# Patient Record
Sex: Male | Born: 1980 | Race: White | Hispanic: No | Marital: Married | Smoking: Current every day smoker
Health system: Southern US, Community
[De-identification: ages and names within clinical notes are randomized; demographics above are authoritative.]

## PROBLEM LIST (undated history)

## (undated) DIAGNOSIS — T7840XA Allergy, unspecified, initial encounter: Secondary | ICD-10-CM

## (undated) DIAGNOSIS — R519 Headache, unspecified: Secondary | ICD-10-CM

## (undated) DIAGNOSIS — Z87828 Personal history of other (healed) physical injury and trauma: Secondary | ICD-10-CM

## (undated) DIAGNOSIS — G47419 Narcolepsy without cataplexy: Secondary | ICD-10-CM

## (undated) DIAGNOSIS — R51 Headache: Secondary | ICD-10-CM

## (undated) DIAGNOSIS — J329 Chronic sinusitis, unspecified: Secondary | ICD-10-CM

## (undated) HISTORY — DX: Headache, unspecified: R51.9

## (undated) HISTORY — DX: Chronic sinusitis, unspecified: J32.9

## (undated) HISTORY — DX: Narcolepsy without cataplexy: G47.419

## (undated) HISTORY — DX: Allergy, unspecified, initial encounter: T78.40XA

## (undated) HISTORY — DX: Headache: R51

## (undated) HISTORY — PX: HERNIA REPAIR: SHX51

## (undated) HISTORY — PX: PATELLAR TENDON REPAIR: SHX737

## (undated) HISTORY — DX: Personal history of other (healed) physical injury and trauma: Z87.828

---

## 2003-07-07 ENCOUNTER — Ambulatory Visit (HOSPITAL_BASED_OUTPATIENT_CLINIC_OR_DEPARTMENT_OTHER): Admission: RE | Admit: 2003-07-07 | Discharge: 2003-07-07 | Payer: Self-pay | Admitting: Internal Medicine

## 2003-07-28 ENCOUNTER — Ambulatory Visit (HOSPITAL_BASED_OUTPATIENT_CLINIC_OR_DEPARTMENT_OTHER): Admission: RE | Admit: 2003-07-28 | Discharge: 2003-07-28 | Payer: Self-pay | Admitting: Internal Medicine

## 2009-03-24 ENCOUNTER — Observation Stay (HOSPITAL_COMMUNITY): Admission: EM | Admit: 2009-03-24 | Discharge: 2009-03-25 | Payer: Self-pay | Admitting: Emergency Medicine

## 2009-05-09 ENCOUNTER — Encounter: Admission: RE | Admit: 2009-05-09 | Discharge: 2009-08-07 | Payer: Self-pay | Admitting: Specialist

## 2009-08-08 ENCOUNTER — Encounter: Admission: RE | Admit: 2009-08-08 | Discharge: 2009-08-16 | Payer: Self-pay | Admitting: Specialist

## 2011-03-06 NOTE — H&P (Signed)
NAME:  Curtis Moreno, Curtis Moreno NO.:  [id number]   MEDICAL RECORD NO.:  [id number]          PATIENT TYPE:  OBV   LOCATION:  0098                         FACILITY:  [facility]   PHYSICIAN:  Erasmo Leventhal, M.D.DATE OF BIRTH:  [birth year]   DATE OF ADMISSION:  03/24/2009  DATE OF DISCHARGE:                              HISTORY & PHYSICAL   CHIEF COMPLAINT:  Painful left knee, unable to extend.   HISTORY OF PRESENT ILLNESS:  The patient is a 30 year old gentleman who  was playing basketball today, when he banged his left knee into a  different player.  He had immediate pain.  He fell to the ground and was  unable to extend his knee and ambulate.  He was brought to the emergency  room.  X-rays revealed there was no obvious fractures, but he had  significant patella alta.  Examination found that he had a patella  tendon rupture.   PAST MEDICAL HISTORY:  Narcolepsy   ALLERGIES:  NO KNOWN DRUG ALLERGIES.   CURRENT MEDICATIONS:  None.   SOCIAL HISTORY:  Patient is married.  Denies any smoking or street drug  use.  He occasionally has an alcoholic beverage.  He manages a [employer].   REVIEW OF SYSTEMS:  He denies any shortness breath or chest pains.  No  other injuries.  No recent fevers or chills or illnesses.   PHYSICAL EXAMINATION:  VITAL SIGNS:  Temperature is 97.6, a pulse of 90,  respirations 18, blood pressure is 110/60.  GENERAL:  This is a healthy-appearing, well-developed gentleman,  conscious, alert and appropriate, on hospital room gurney, with his left  leg in a long-leg immobilizer.  HEENT: Head was normocephalic.  Pupils equal, round and reactive.  NECK:  There was good range of motion, no lymphadenopathy.  CHEST:  Lung sounds clear and equal bilaterally.  HEART:  Regular rate and rhythm.  No murmurs, rubs or gallops.  ABDOMEN:  Soft, nontender.  Bowel sounds present.  EXTREMITIES:  Upper extremities had excellent range of motion, without  any difficulty.   Lower extremities:  Left knee was significantly  swollen.  He has significant effusion.  He had a palpable defect  inferior to the patella.  He was unable to straight leg raise.  He did  have pain.  His calf was soft, nontender.  Good sensation to the lower  foot.  His right leg:  He was able to straight leg raise.  He had good  range of motion of his knee and ankle today.  NEURO:  The patient was conscious, alert and appropriate.  BREASTS/RECTAL/GENITOURINARY:  exams were deferred at this time.   IMPRESSION:  1. Left knee patella tendon rupture.  2. History of narcolepsy   PLAN:  The patient will be admitted to [facility] under the  care of Dr. Erasmo Leventhal on a 23-hour observation.  Will obtain  an operating room time and take him to the OR for an ORIF of his left  patella tendon.  The patient did have one discussion with Dr. Thomasena Edis  about the procedure and his need  for having this done.  He was also told  what the possible complications are.      Jamelle Rushing, P.A.    ______________________________  Erasmo Leventhal, M.D.    RWK/MEDQ  D:  03/24/2009  T:  03/24/2009  Job:  [id number]

## 2011-03-06 NOTE — Op Note (Signed)
NAME:  Curtis Moreno, POCH NO.:  [id number]   MEDICAL RECORD NO.:  [id number]          PATIENT TYPE:  OBV   LOCATION:  0098                         FACILITY:  [facility]   PHYSICIAN:  Erasmo Leventhal, M.D.DATE OF BIRTH:  Mar 10, 1981   DATE OF PROCEDURE:  03/24/2009  DATE OF DISCHARGE:                               OPERATIVE REPORT   PREOPERATIVE DIAGNOSIS:  Left knee acute complete rupture of patella  tendon.   POSTOPERATIVE DIAGNOSIS:  Left knee acute complete rupture of patella  tendon, plus extension into medial and lateral capsule and retinaculum.   PROCEDURE:  Left knee primary repair of patella tendon rupture, and  medial and lateral capsule and retinaculum extensor mechanism.   SURGEON:  Erasmo Leventhal, M.D.   ASSISTANT:  Oneida Alar, PA-C.   ANESTHESIA:  General with femoral nerve block, intraoperatively.   ESTIMATED BLOOD LOSS:  Less than 50 mL.   DRAINS:  None.   COMPLICATIONS:  None.   TOURNIQUET TIME:  45 minutes at 300 mmHg.   DRAINS:  None.   COMPLICATIONS:  None.   DISPOSITION:  PACU stable.   OPERATIVE DETAILS:  The patient counseled in the holding area and the  correct side was identified.  IV started.  Antibiotics were given on the  way from the ER.  In the operating room he was placed under general  anesthesia.  All extremities well padded and bumped.  Left knee was  shaved appropriately.  Prepped with DuraPrep and draped in sterile  fashion exsanguinated with Esmarch and tourniquet was inflated to 300  mmHg.  Straight midline incision made in the skin and subcutaneous  tissue overlying the extensor mechanism and carried through the skin and  subcutaneous tissue.  Retinaculum was opened found to have rupture of  the medial and lateral retinaculum and the arthrotomy and capsule.  Also  complete rupture of the patellar tendon in an oblique coronal fashion  from the patella.  The edges were sharpened back to healthy tissue.  Knee  was then inspected and the ACL and PCL were intact from the limited  view and knee joint was copiously irrigated.  In full extension we set  the correct tension on the patellar tendon, drill holes placed in the  patella, #5 FiberWire sutures were placed in a grasping fashion, placed  through the drill holes and back on themselves giving anatomic repair of  the patella tendon back at the appropriate amount of tension and length.  This was then reinforced medial and lateral with #2 FiberWire sutures  grasping the capsule.   The retinaculum was next closed as a separate layer with Vicryl suture,  subcu Vicryl, skin with subcu Monocryl suture.  Steri-Strips applied.  Dressings applied.  Tourniquet was deflated.  Normal circulation of foot  and ankle at end of the case.  Another gram of Ancef was given  intravenously at the end of the case.  Placed a well-molded knee  immobilizer in full extension.  Properly fitted and contoured.  He was  then awakened, taken from operating room to PACU.   He also had  a femoral nerve block administered in the operating room by  the anesthesiologist.  He was awakened, and taken from operating room to  PACU stable condition.  Sponge and needle count correct.  No  complications or problems.   To help with surgical technique and decision making Mr. Oneida Alar, PA-C's  assistance was needed throughout the entire case.           ______________________________  Erasmo Leventhal, M.D.     RAC/MEDQ  D:  03/24/2009  T:  03/25/2009  Job:  [id number]

## 2013-06-15 ENCOUNTER — Ambulatory Visit (INDEPENDENT_AMBULATORY_CARE_PROVIDER_SITE_OTHER): Payer: BC Managed Care – PPO | Admitting: Internal Medicine

## 2013-06-15 VITALS — BP 132/74 | HR 65 | Temp 98.0°F | Resp 16 | Ht 74.75 in | Wt 184.0 lb

## 2013-06-15 DIAGNOSIS — M5416 Radiculopathy, lumbar region: Secondary | ICD-10-CM

## 2013-06-15 DIAGNOSIS — M25579 Pain in unspecified ankle and joints of unspecified foot: Secondary | ICD-10-CM

## 2013-06-15 DIAGNOSIS — IMO0002 Reserved for concepts with insufficient information to code with codable children: Secondary | ICD-10-CM

## 2013-06-15 DIAGNOSIS — M25572 Pain in left ankle and joints of left foot: Secondary | ICD-10-CM

## 2013-06-15 DIAGNOSIS — Z0000 Encounter for general adult medical examination without abnormal findings: Secondary | ICD-10-CM

## 2013-06-15 DIAGNOSIS — G47419 Narcolepsy without cataplexy: Secondary | ICD-10-CM

## 2013-06-15 LAB — CBC WITH DIFFERENTIAL/PLATELET
Basophils Absolute: 0 10*3/uL (ref 0.0–0.1)
Eosinophils Absolute: 0.2 10*3/uL (ref 0.0–0.7)
Eosinophils Relative: 3 % (ref 0–5)
Lymphocytes Relative: 31 % (ref 12–46)
Lymphs Abs: 1.8 10*3/uL (ref 0.7–4.0)
MCV: 89.9 fL (ref 78.0–100.0)
Neutrophils Relative %: 60 % (ref 43–77)
Platelets: 208 10*3/uL (ref 150–400)
RBC: 4.54 MIL/uL (ref 4.22–5.81)
RDW: 12.9 % (ref 11.5–15.5)
WBC: 5.9 10*3/uL (ref 4.0–10.5)

## 2013-06-15 LAB — POCT URINALYSIS DIPSTICK
Bilirubin, UA: NEGATIVE
Glucose, UA: NEGATIVE
Ketones, UA: NEGATIVE
Leukocytes, UA: NEGATIVE
Nitrite, UA: NEGATIVE
pH, UA: 6

## 2013-06-15 LAB — COMPREHENSIVE METABOLIC PANEL
ALT: 17 U/L (ref 0–53)
AST: 36 U/L (ref 0–37)
CO2: 30 mEq/L (ref 19–32)
Chloride: 106 mEq/L (ref 96–112)
Creat: 0.8 mg/dL (ref 0.50–1.35)
Sodium: 140 mEq/L (ref 135–145)
Total Bilirubin: 0.8 mg/dL (ref 0.3–1.2)
Total Protein: 7.1 g/dL (ref 6.0–8.3)

## 2013-06-15 LAB — LIPID PANEL
LDL Cholesterol: 73 mg/dL (ref 0–99)
Total CHOL/HDL Ratio: 2.9 Ratio
VLDL: 13 mg/dL (ref 0–40)

## 2013-06-15 LAB — POCT UA - MICROSCOPIC ONLY
RBC, urine, microscopic: NEGATIVE
WBC, Ur, HPF, POC: NEGATIVE
Yeast, UA: NEGATIVE

## 2013-06-15 LAB — TSH: TSH: 1.332 u[IU]/mL (ref 0.350–4.500)

## 2013-06-15 MED ORDER — AMPHETAMINE-DEXTROAMPHETAMINE 20 MG PO TABS: 20.0000 mg | ORAL_TABLET | Freq: Two times a day (BID) | ORAL | Status: DC

## 2013-06-15 MED ORDER — CYCLOBENZAPRINE HCL 10 MG PO TABS: 10.0000 mg | ORAL_TABLET | Freq: Every day | ORAL | Status: DC

## 2013-06-15 NOTE — Progress Notes (Signed)
Subjective:    Patient ID: Curtis Moreno, male    DOB: [birth year], 32 y.o.   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 Cori Razor problems also included nightmares and nonrestorative sleep He still has restless sleep with nightmares He does extremely well during the day with Adderall    Review of Systems  Constitutional: Negative for fever, activity change, appetite change, fatigue and unexpected weight change.  HENT: Negative for hearing loss, congestion, rhinorrhea, trouble swallowing, neck pain and ear discharge.   Eyes: Negative for visual disturbance.  Respiratory: Negative for apnea and shortness of breath.   Cardiovascular: Negative for chest pain, palpitations and leg swelling.  Gastrointestinal: Negative for abdominal pain, diarrhea and constipation.  Endocrine: Negative for cold intolerance and polydipsia.  Genitourinary: Negative for dysuria, frequency and difficulty urinating.  Musculoskeletal: Positive for back pain. Negative for joint swelling and gait problem.       1. Chronic ankle pain on the left after a significant sprain injury many years ago//he now has pain continuously without much swelling. He notices a lot of popping and grinding  2. he is ruptured his patellar tendon and had to have surgery 2 years ago and still has a little discomfort in the scar line 3. he has persistent low back discomfort particularly with bending over and lifting. There are some radicular symptoms on the right side but no weakness or numbness. This does not interfere with sleep  Neurological: Negative for dizziness, weakness, light-headedness and headaches.  Psychiatric/Behavioral: Negative for behavioral  problems and dysphoric mood.       Objective:   Physical Exam  Constitutional: He is oriented to person, place, and time. He appears well-developed and well-nourished.  HENT:  Head: Normocephalic.  Right Ear: External ear normal.  Left Ear: External ear normal.  Nose: Nose normal.  Mouth/Throat: Oropharynx is clear and moist.  Eyes: Conjunctivae and EOM are normal. Pupils are equal, round, and reactive to light.  Neck: Normal range of motion. Neck supple. No thyromegaly present.  Cardiovascular: Normal rate, regular rhythm, normal heart sounds and intact distal pulses.  Exam reveals no gallop and no friction rub.   No murmur heard. Pulmonary/Chest: Effort normal and breath sounds normal. He has no wheezes.  Abdominal: Soft. Bowel sounds are normal. He exhibits no mass. There is tenderness. There is no rebound and no guarding.  Mildly tender in the right lower quadrant  Genitourinary: Penis normal.  Both testicles are small/there is a lump structure in the vas deferens on the right that has been present for several years/no testicular masses  Musculoskeletal: He exhibits no edema.  The left ankle is very tender along the route of the peroneal tendon. The ankle itself has a crepitus feel to inversion felt over the lateral malleolus//there is a callus under the second MTP on the left that is not present on the right  He has a right-sided lumbar pain with straight-leg raise on the right to 75 with some radiation into the right buttock/no sensory or motor losses and no change in reflexes  Lymphadenopathy:    He has no cervical adenopathy.  Neurological: He is alert and oriented to person, place, and time. He has normal reflexes. No cranial nerve deficit. Coordination normal.  Skin: No rash noted.  Psychiatric: He has a normal mood and affect. His behavior is normal. Judgment and thought content normal.          Results for orders placed in visit on 06/15/13  POCT UA - MICROSCOPIC  ONLY      Result Value Range   WBC, Ur, HPF, POC neg     RBC, urine, microscopic neg     Bacteria, U Microscopic neg     Mucus, UA neg     Epithelial cells, urine per micros neg     Crystals, Ur, HPF, POC neg     Casts, Ur, LPF, POC neg     Yeast, UA neg    POCT URINALYSIS DIPSTICK      Result Value Range   Color, UA yellow     Clarity, UA clear     Glucose, UA neg     Bilirubin, UA neg     Ketones, UA neg     Spec Grav, UA >=1.030     Blood, UA neg     pH, UA 6.0     Protein, UA neg     Urobilinogen, UA 0.2     Nitrite, UA neg     Leukocytes, UA Negative      Assessment & Plan:  CPE  #1 narcolepsy  #2 idiopathic sleep disorder  #3 chronic ankle pain  #4 status post patellar tendon rupture with surgery  #5 chronic low back pain /? Right lower quadrant pain   Referred to sports medicine-Dr. Margaretha Sheffield Consider sleep reevaluation Continue Adderall for now Use Flexeril at bedtime for the next few months Discussed yoga/gave handout booklet for chronic back pain with exercises and posture habits At next visit review immunizations   Meds ordered this encounter  Medications  . amphetamine-dextroamphetamine (ADDERALL) 20 MG tablet    Sig: Take 1 tablet (20 mg total) by mouth 2 (two) times daily.    Dispense:  60 tablet    Refill:  0  . amphetamine-dextroamphetamine (ADDERALL) 20 MG tablet    Sig: Take 1 tablet (20 mg total) by mouth 2 (two) times daily. For 07/16/13    Dispense:  60 tablet    Refill:  0  . amphetamine-dextroamphetamine (ADDERALL) 20 MG tablet    Sig: Take 1 tablet (20 mg total) by mouth 2 (two) times daily. For 08/15/13    Dispense:  60 tablet    Refill:  0  . cyclobenzaprine (FLEXERIL) 10 MG tablet    Sig: Take 1 tablet (10 mg total) by mouth at bedtime. For back pain    Dispense:  30 tablet    Refill:  2   Recheck 3 months or call for refills and recheck in 6 months

## 2013-06-21 ENCOUNTER — Encounter: Payer: Self-pay | Admitting: Internal Medicine

## 2013-07-01 ENCOUNTER — Ambulatory Visit: Payer: Self-pay | Admitting: Sports Medicine

## 2013-07-15 ENCOUNTER — Ambulatory Visit (INDEPENDENT_AMBULATORY_CARE_PROVIDER_SITE_OTHER): Payer: BC Managed Care – PPO | Admitting: Sports Medicine

## 2013-07-15 VITALS — BP 107/70 | Ht 75.0 in | Wt 185.0 lb

## 2013-07-15 DIAGNOSIS — S9306XA Dislocation of unspecified ankle joint, initial encounter: Secondary | ICD-10-CM

## 2013-07-15 DIAGNOSIS — S9305XA Dislocation of left ankle joint, initial encounter: Secondary | ICD-10-CM

## 2013-07-15 DIAGNOSIS — S93332A Other subluxation of left foot, initial encounter: Secondary | ICD-10-CM

## 2013-07-15 DIAGNOSIS — M25579 Pain in unspecified ankle and joints of unspecified foot: Secondary | ICD-10-CM

## 2013-07-15 DIAGNOSIS — M25572 Pain in left ankle and joints of left foot: Secondary | ICD-10-CM

## 2013-07-15 NOTE — Progress Notes (Signed)
  Subjective:    Patient ID: Curtis Moreno, male    DOB: 19-Dec-1980, 32 y.o.   MRN: [id number]  HPI chief complaint: Left ankle pain  Very pleasant 32 year old male comes in today for evaluation of his left ankle. Many years ago he suffered a rather severe ankle sprain and since then he has had persistent lateral pain and popping. He had a significant amount of swelling with the initial injury but denies any residual swelling. He localizes all of his symptoms to the retromalleolar area behind the lateral malleolus. No radiating pain into his foot. Although his symptoms are persistent they do not interfere with his quality of life. He just "notices the pain". He has not had any recent imaging. He is also complaining of pain at the plantar aspect of his left foot, specifically along the second metatarsal head. His job requires him to stand for long periods of time. No prior surgeries to either his foot or ankle in the past.  Past medical history and current medications are reviewed. Surgical history is significant for left knee patellar tendon repair done many years ago while in high school No known drug allergies Does not smoke, denies alcohol use, and works as a Art therapist for [employer]    Review of Systems     Objective:   Physical Exam Well-developed, well-nourished. No acute distress. Awake alert and oriented x3. Vital signs are reviewed  Left knee: Full range of motion. No effusion. Well-healed midline incision consistent with his prior patellar tendon repair. Good strength. Good stability.  Left ankle: Full range of motion. No effusion. No soft tissue swelling. 2+ talar tilt, positive anterior drawer. Tenderness to palpation in the retromalleolar groove and mild pain with resisted foot eversion. There is palpable and audible snapping of the peroneal tendons over the lateral malleolus with active foot circumduction. It is not terribly painful however. No tenderness to palpation  over the medial ankle. No tenderness at the base of the fifth metatarsal. Patient has collapse of the transverse arch with standing. There is callus formation over the second metatarsal head and tenderness to palpation here. Walking without a limp.       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 Therefore, I've recommended that we start with a body helix compression sleeve to be worn with activity and couple this with an aggressive home exercise ankle rehabilitation program. Patient will followup with me in 6 weeks. We will also give him a pair of green sports insoles with a metatarsal pad on the left for his current metatarsalgia.

## 2013-08-26 ENCOUNTER — Ambulatory Visit: Payer: BC Managed Care – PPO | Admitting: Sports Medicine

## 2013-08-27 ENCOUNTER — Other Ambulatory Visit: Payer: Self-pay

## 2013-09-28 ENCOUNTER — Ambulatory Visit (INDEPENDENT_AMBULATORY_CARE_PROVIDER_SITE_OTHER): Payer: BC Managed Care – PPO | Admitting: Internal Medicine

## 2013-09-28 VITALS — BP 122/76 | HR 76 | Temp 98.4°F | Resp 12 | Ht 76.0 in | Wt 180.0 lb

## 2013-09-28 DIAGNOSIS — G47419 Narcolepsy without cataplexy: Secondary | ICD-10-CM

## 2013-09-28 MED ORDER — AMPHETAMINE-DEXTROAMPHETAMINE 20 MG PO TABS: 20.0000 mg | ORAL_TABLET | Freq: Two times a day (BID) | ORAL | Status: DC

## 2013-09-30 NOTE — Progress Notes (Signed)
Patient Active Problem List   Diagnosis Date Noted  . Narcolepsy 06/15/2013  doing well with meds Has #1 [employer] Back ankle both better Ros -neg Exam-stable  Plan Meds ordered this encounter  Medications  . amphetamine-dextroamphetamine (ADDERALL) 20 MG tablet    Sig: Take 1 tablet (20 mg total) by mouth 2 (two) times daily. For 60 days after signing date    Dispense:  60 tablet    Refill:  0  . amphetamine-dextroamphetamine (ADDERALL) 20 MG tablet    Sig: Take 1 tablet (20 mg total) by mouth 2 (two) times daily.    Dispense:  60 tablet    Refill:  0  . amphetamine-dextroamphetamine (ADDERALL) 20 MG tablet    Sig: Take 1 tablet (20 mg total) by mouth 2 (two) times daily. For 30 days after date signed    Dispense:  60 tablet    Refill:  0   Call 85mo   F/u 6mos

## 2013-10-28 ENCOUNTER — Telehealth: Payer: Self-pay

## 2013-10-28 DIAGNOSIS — G47419 Narcolepsy without cataplexy: Secondary | ICD-10-CM

## 2013-10-28 MED ORDER — AMPHETAMINE-DEXTROAMPHETAMINE 20 MG PO TABS: 20.0000 mg | ORAL_TABLET | Freq: Two times a day (BID) | ORAL | Status: DC

## 2013-10-28 NOTE — Telephone Encounter (Signed)
He states he placed in a bag with his prescription, and he thinks they were thrown away please advise, will you replace his lost Rx's

## 2013-10-28 NOTE — Telephone Encounter (Signed)
Yes, this is correct, pended for signature, Shota Kohrs

## 2013-10-28 NOTE — Telephone Encounter (Signed)
He is completely honest/trustworthy and is taking meds for narcolepsy so I'm glad to replace-- 1/8 and 2/8 right???

## 2013-10-28 NOTE — Telephone Encounter (Signed)
PT STATES HE HAVE MISPLACED HIS ADDERALL PRESCRIPTION. PLEASE CALL [phone]

## 2013-10-29 NOTE — Telephone Encounter (Signed)
Pt called back and stated that he found his rx - it had fallen behind his dresser. He states he doesn't need the rx replaced.  bf

## 2013-10-30 NOTE — Telephone Encounter (Signed)
Replacement Rxs shredded.

## 2014-01-12 ENCOUNTER — Other Ambulatory Visit: Payer: Self-pay

## 2014-01-12 DIAGNOSIS — G47419 Narcolepsy without cataplexy: Secondary | ICD-10-CM

## 2014-01-12 NOTE — Telephone Encounter (Signed)
Pt requesting refill for adderall  [facility] phone [phone]

## 2014-01-13 MED ORDER — AMPHETAMINE-DEXTROAMPHETAMINE 20 MG PO TABS: 20.0000 mg | ORAL_TABLET | Freq: Two times a day (BID) | ORAL | Status: DC

## 2014-01-13 NOTE — Telephone Encounter (Signed)
Pended.

## 2014-01-15 NOTE — Telephone Encounter (Signed)
Pt has p/up Rx. 

## 2014-04-19 ENCOUNTER — Telehealth: Payer: Self-pay

## 2014-04-19 DIAGNOSIS — G47419 Narcolepsy without cataplexy: Secondary | ICD-10-CM

## 2014-04-19 NOTE — Telephone Encounter (Signed)
[phone]  amphetamine-dextroamphetamine (ADDERALL) 20 MG tablet   Refill  Dr Merla Richesoolittle

## 2014-04-20 MED ORDER — AMPHETAMINE-DEXTROAMPHETAMINE 20 MG PO TABS: 20.0000 mg | ORAL_TABLET | Freq: Two times a day (BID) | ORAL | Status: DC

## 2014-04-20 NOTE — Telephone Encounter (Signed)
Patient Active Problem List   Diagnosis Date Noted  . Narcolepsy 06/15/2013   Meds ordered this encounter  Medications  . amphetamine-dextroamphetamine (ADDERALL) 20 MG tablet    Sig: Take 1 tablet (20 mg total) by mouth 2 (two) times daily. For 30 days after date signed    Dispense:  60 tablet    Refill:  0  . amphetamine-dextroamphetamine (ADDERALL) 20 MG tablet    Sig: Take 1 tablet (20 mg total) by mouth 2 (two) times daily. For 60 days after date signed.    Dispense:  60 tablet    Refill:  0  . amphetamine-dextroamphetamine (ADDERALL) 20 MG tablet    Sig: Take 1 tablet (20 mg total) by mouth 2 (two) times daily.    Dispense:  60 tablet    Refill:  0   Ok to f/u 6 mos

## 2014-04-20 NOTE — Telephone Encounter (Signed)
Advised pt rx will be ready for pick up after 6.

## 2014-07-20 ENCOUNTER — Telehealth: Payer: Self-pay

## 2014-07-20 DIAGNOSIS — G47419 Narcolepsy without cataplexy: Secondary | ICD-10-CM

## 2014-07-20 NOTE — Telephone Encounter (Signed)
Refill request   amphetamine-dextroamphetamine (ADDERALL) 20 MG tablet   [phone]

## 2014-07-21 MED ORDER — AMPHETAMINE-DEXTROAMPHETAMINE 20 MG PO TABS: 20.0000 mg | ORAL_TABLET | Freq: Two times a day (BID) | ORAL | Status: DC

## 2014-07-21 NOTE — Telephone Encounter (Signed)
Notified pt ready. 

## 2014-10-27 ENCOUNTER — Telehealth: Payer: Self-pay

## 2014-10-27 DIAGNOSIS — G47419 Narcolepsy without cataplexy: Secondary | ICD-10-CM

## 2014-10-27 NOTE — Telephone Encounter (Signed)
Pt request Adderall refill   [facility] phone for pt is [phone]

## 2014-10-29 MED ORDER — AMPHETAMINE-DEXTROAMPHETAMINE 20 MG PO TABS: 20.0000 mg | ORAL_TABLET | Freq: Two times a day (BID) | ORAL | Status: DC

## 2014-10-29 NOTE — Telephone Encounter (Signed)
Ok --dx narcolepsy Meds ordered this encounter  Medications  . amphetamine-dextroamphetamine (ADDERALL) 20 MG tablet    Sig: Take 1 tablet (20 mg total) by mouth 2 (two) times daily. For 30 days after date signed    Dispense:  60 tablet    Refill:  0  . amphetamine-dextroamphetamine (ADDERALL) 20 MG tablet    Sig: Take 1 tablet (20 mg total) by mouth 2 (two) times daily. For 60 days after date signed.    Dispense:  60 tablet    Refill:  0  . amphetamine-dextroamphetamine (ADDERALL) 20 MG tablet    Sig: Take 1 tablet (20 mg total) by mouth 2 (two) times daily.    Dispense:  60 tablet    Refill:  0   If he needs a prescription before Monday night someone else can sign prescription #1 for him

## 2014-10-29 NOTE — Telephone Encounter (Signed)
Pt advised to pick up script on Monday

## 2015-02-01 ENCOUNTER — Telehealth: Payer: Self-pay

## 2015-02-01 DIAGNOSIS — G47419 Narcolepsy without cataplexy: Secondary | ICD-10-CM

## 2015-02-01 MED ORDER — AMPHETAMINE-DEXTROAMPHETAMINE 20 MG PO TABS: 20.0000 mg | ORAL_TABLET | Freq: Two times a day (BID) | ORAL | Status: DC

## 2015-02-01 NOTE — Telephone Encounter (Signed)
Pt's wife called wanting a refill on her husband's prescription for his  amphetamine-dextroamphetamine (ADDERALL) 20 MG tablet [[id number]][[id number]]. Please advise at [phone]

## 2015-02-01 NOTE — Telephone Encounter (Signed)
Narcolepsy Meds ordered this encounter  Medications  . amphetamine-dextroamphetamine (ADDERALL) 20 MG tablet    Sig: Take 1 tablet (20 mg total) by mouth 2 (two) times daily.    Dispense:  60 tablet    Refill:  0  . amphetamine-dextroamphetamine (ADDERALL) 20 MG tablet    Sig: Take 1 tablet (20 mg total) by mouth 2 (two) times daily. For 60 days after date signed.    Dispense:  60 tablet    Refill:  0  . amphetamine-dextroamphetamine (ADDERALL) 20 MG tablet    Sig: Take 1 tablet (20 mg total) by mouth 2 (two) times daily. For 30 days after date signed    Dispense:  60 tablet    Refill:  0

## 2015-02-01 NOTE — Telephone Encounter (Signed)
Called pt, .rx ready for pick up. Left VM.

## 2015-05-03 ENCOUNTER — Telehealth: Payer: Self-pay

## 2015-05-03 DIAGNOSIS — G47419 Narcolepsy without cataplexy: Secondary | ICD-10-CM

## 2015-05-03 MED ORDER — AMPHETAMINE-DEXTROAMPHETAMINE 20 MG PO TABS: 20.0000 mg | ORAL_TABLET | Freq: Two times a day (BID) | ORAL | Status: DC

## 2015-05-03 NOTE — Telephone Encounter (Signed)
Dr. Merla Richesoolittle   Patient requesting refill amphetamine-dextroamphetamine (ADDERALL) 20 MG tablet   Please call when ready [phone]

## 2015-05-03 NOTE — Telephone Encounter (Signed)
Meds ordered this encounter  Medications  . amphetamine-dextroamphetamine (ADDERALL) 20 MG tablet    Sig: Take 1 tablet (20 mg total) by mouth 2 (two) times daily.    Dispense:  60 tablet    Refill:  0  . amphetamine-dextroamphetamine (ADDERALL) 20 MG tablet    Sig: Take 1 tablet (20 mg total) by mouth 2 (two) times daily. For 60 days after date signed.    Dispense:  60 tablet    Refill:  0  . amphetamine-dextroamphetamine (ADDERALL) 20 MG tablet    Sig: Take 1 tablet (20 mg total) by mouth 2 (two) times daily. For 30 days after date signed    Dispense:  60 tablet    Refill:  0

## 2015-05-04 NOTE — Telephone Encounter (Signed)
Rx in pick up draw. Called pt to let him know.

## 2015-05-13 ENCOUNTER — Ambulatory Visit (INDEPENDENT_AMBULATORY_CARE_PROVIDER_SITE_OTHER): Payer: PRIVATE HEALTH INSURANCE | Admitting: Internal Medicine

## 2015-05-13 ENCOUNTER — Ambulatory Visit (INDEPENDENT_AMBULATORY_CARE_PROVIDER_SITE_OTHER): Payer: PRIVATE HEALTH INSURANCE

## 2015-05-13 ENCOUNTER — Ambulatory Visit (HOSPITAL_COMMUNITY)
Admission: RE | Admit: 2015-05-13 | Discharge: 2015-05-13 | Disposition: A | Discharge status: Home / Self Care | Payer: 59 | Source: Ambulatory Visit | Attending: Internal Medicine | Admitting: Internal Medicine

## 2015-05-13 VITALS — BP 130/80 | HR 92 | Temp 98.4°F | Resp 16 | Ht 75.0 in | Wt 176.0 lb

## 2015-05-13 DIAGNOSIS — Z720 Tobacco use: Secondary | ICD-10-CM | POA: Diagnosis not present

## 2015-05-13 DIAGNOSIS — G4452 New daily persistent headache (NDPH): Secondary | ICD-10-CM | POA: Diagnosis not present

## 2015-05-13 DIAGNOSIS — R413 Other amnesia: Secondary | ICD-10-CM | POA: Diagnosis not present

## 2015-05-13 DIAGNOSIS — R454 Irritability and anger: Secondary | ICD-10-CM

## 2015-05-13 DIAGNOSIS — F329 Major depressive disorder, single episode, unspecified: Secondary | ICD-10-CM

## 2015-05-13 DIAGNOSIS — F341 Dysthymic disorder: Secondary | ICD-10-CM | POA: Diagnosis not present

## 2015-05-13 DIAGNOSIS — H538 Other visual disturbances: Secondary | ICD-10-CM | POA: Insufficient documentation

## 2015-05-13 DIAGNOSIS — R479 Unspecified speech disturbances: Secondary | ICD-10-CM

## 2015-05-13 DIAGNOSIS — M542 Cervicalgia: Secondary | ICD-10-CM | POA: Diagnosis not present

## 2015-05-13 DIAGNOSIS — S060X1A Concussion with loss of consciousness of 30 minutes or less, initial encounter: Secondary | ICD-10-CM

## 2015-05-13 DIAGNOSIS — F17200 Nicotine dependence, unspecified, uncomplicated: Secondary | ICD-10-CM

## 2015-05-13 DIAGNOSIS — R51 Headache: Secondary | ICD-10-CM | POA: Diagnosis present

## 2015-05-13 MED ORDER — CYCLOBENZAPRINE HCL 10 MG PO TABS: 10.0000 mg | ORAL_TABLET | Freq: Every day | ORAL | Status: DC

## 2015-05-13 MED ORDER — MELOXICAM 15 MG PO TABS: 15.0000 mg | ORAL_TABLET | Freq: Every day | ORAL | Status: DC

## 2015-05-13 MED ORDER — FLUOXETINE HCL 20 MG PO TABS: 20.0000 mg | ORAL_TABLET | Freq: Every day | ORAL | Status: DC
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
## 2015-05-13 NOTE — Patient Instructions (Signed)
Go to [facility] and register at the Emergency Department for OUTPATIENT CT. DO NOT REGISTER as ED patient.

## 2015-07-01 ENCOUNTER — Telehealth: Payer: Self-pay | Admitting: Internal Medicine

## 2015-07-01 NOTE — Telephone Encounter (Signed)
Pt called about CT result, please call to follow up.  [phone]

## 2015-07-02 NOTE — Telephone Encounter (Signed)
CT from July was normal

## 2015-07-04 ENCOUNTER — Telehealth: Payer: Self-pay | Admitting: Family Medicine

## 2015-07-04 NOTE — Telephone Encounter (Signed)
Tried to call patient but got no answer left voicemail to call back. He wanted to know about CT and [place] responded.

## 2015-07-04 NOTE — Telephone Encounter (Signed)
Spoke with pt and relayed Dr. Netta Corrigan msg about normal CT results. No further action req. By [facility]...  [phone]

## 2015-07-04 NOTE — Telephone Encounter (Signed)
LMOM OF NORMAL RESULT  CT from July was normal

## 2015-07-19 ENCOUNTER — Encounter (HOSPITAL_COMMUNITY): Payer: Self-pay | Admitting: Emergency Medicine

## 2015-07-19 ENCOUNTER — Encounter (HOSPITAL_COMMUNITY): Payer: Self-pay | Admitting: *Deleted

## 2015-07-19 ENCOUNTER — Emergency Department (HOSPITAL_COMMUNITY)
Admission: EM | Admit: 2015-07-19 | Discharge: 2015-07-19 | Disposition: A | Discharge status: Other Acute Inpatient Hospital | Payer: 59 | Attending: Emergency Medicine | Admitting: Emergency Medicine

## 2015-07-19 ENCOUNTER — Inpatient Hospital Stay (HOSPITAL_COMMUNITY)
Admission: AD | Admit: 2015-07-19 | Discharge: 2015-07-21 | DRG: 897 | Disposition: A | Discharge status: Home / Self Care | Payer: 59 | Source: Intra-hospital | Attending: Psychiatry | Admitting: Psychiatry

## 2015-07-19 DIAGNOSIS — R45851 Suicidal ideations: Secondary | ICD-10-CM

## 2015-07-19 DIAGNOSIS — F1020 Alcohol dependence, uncomplicated: Secondary | ICD-10-CM | POA: Diagnosis not present

## 2015-07-19 DIAGNOSIS — Z23 Encounter for immunization: Secondary | ICD-10-CM | POA: Insufficient documentation

## 2015-07-19 DIAGNOSIS — Z79899 Other long term (current) drug therapy: Secondary | ICD-10-CM | POA: Diagnosis not present

## 2015-07-19 DIAGNOSIS — G47419 Narcolepsy without cataplexy: Secondary | ICD-10-CM | POA: Insufficient documentation

## 2015-07-19 DIAGNOSIS — Y9389 Activity, other specified: Secondary | ICD-10-CM | POA: Insufficient documentation

## 2015-07-19 DIAGNOSIS — F329 Major depressive disorder, single episode, unspecified: Secondary | ICD-10-CM | POA: Diagnosis present

## 2015-07-19 DIAGNOSIS — Z720 Tobacco use: Secondary | ICD-10-CM | POA: Diagnosis not present

## 2015-07-19 DIAGNOSIS — F1510 Other stimulant abuse, uncomplicated: Secondary | ICD-10-CM | POA: Insufficient documentation

## 2015-07-19 DIAGNOSIS — F322 Major depressive disorder, single episode, severe without psychotic features: Secondary | ICD-10-CM | POA: Diagnosis not present

## 2015-07-19 DIAGNOSIS — S60512A Abrasion of left hand, initial encounter: Secondary | ICD-10-CM | POA: Insufficient documentation

## 2015-07-19 DIAGNOSIS — F332 Major depressive disorder, recurrent severe without psychotic features: Secondary | ICD-10-CM | POA: Diagnosis present

## 2015-07-19 DIAGNOSIS — Y998 Other external cause status: Secondary | ICD-10-CM | POA: Insufficient documentation

## 2015-07-19 DIAGNOSIS — F1024 Alcohol dependence with alcohol-induced mood disorder: Principal | ICD-10-CM | POA: Diagnosis present

## 2015-07-19 DIAGNOSIS — Y9289 Other specified places as the place of occurrence of the external cause: Secondary | ICD-10-CM | POA: Diagnosis not present

## 2015-07-19 DIAGNOSIS — X788XXA Intentional self-harm by other sharp object, initial encounter: Secondary | ICD-10-CM | POA: Insufficient documentation

## 2015-07-19 DIAGNOSIS — Z046 Encounter for general psychiatric examination, requested by authority: Secondary | ICD-10-CM | POA: Diagnosis present

## 2015-07-19 LAB — BASIC METABOLIC PANEL
ANION GAP: 6 (ref 5–15)
BUN: 10 mg/dL (ref 6–20)
CALCIUM: 9.3 mg/dL (ref 8.9–10.3)
CO2: 29 mmol/L (ref 22–32)
Chloride: 108 mmol/L (ref 101–111)
Creatinine, Ser: 0.79 mg/dL (ref 0.61–1.24)
Glucose, Bld: 99 mg/dL (ref 65–99)
Potassium: 3.9 mmol/L (ref 3.5–5.1)
Sodium: 143 mmol/L (ref 135–145)

## 2015-07-19 LAB — URINALYSIS, ROUTINE W REFLEX MICROSCOPIC
BILIRUBIN URINE: NEGATIVE
Glucose, UA: NEGATIVE mg/dL
Hgb urine dipstick: NEGATIVE
KETONES UR: NEGATIVE mg/dL
Leukocytes, UA: NEGATIVE
NITRITE: NEGATIVE
Protein, ur: NEGATIVE mg/dL
Specific Gravity, Urine: 1.005 (ref 1.005–1.030)
UROBILINOGEN UA: 0.2 mg/dL (ref 0.0–1.0)
pH: 6 (ref 5.0–8.0)

## 2015-07-19 LAB — CBC WITH DIFFERENTIAL/PLATELET
BASOS ABS: 0 10*3/uL (ref 0.0–0.1)
BASOS PCT: 0 %
EOS PCT: 5 %
Eosinophils Absolute: 0.4 10*3/uL (ref 0.0–0.7)
HCT: 45.4 % (ref 39.0–52.0)
Hemoglobin: 15.9 g/dL (ref 13.0–17.0)
Lymphocytes Relative: 37 %
Lymphs Abs: 3.4 10*3/uL (ref 0.7–4.0)
MCH: 32.5 pg (ref 26.0–34.0)
MCHC: 35 g/dL (ref 30.0–36.0)
MCV: 92.8 fL (ref 78.0–100.0)
MONO ABS: 0.4 10*3/uL (ref 0.1–1.0)
Monocytes Relative: 5 %
NEUTROS ABS: 5.1 10*3/uL (ref 1.7–7.7)
Neutrophils Relative %: 53 %
PLATELETS: 213 10*3/uL (ref 150–400)
RBC: 4.89 MIL/uL (ref 4.22–5.81)
RDW: 12.6 % (ref 11.5–15.5)
WBC: 9.4 10*3/uL (ref 4.0–10.5)

## 2015-07-19 LAB — RAPID URINE DRUG SCREEN, HOSP PERFORMED
Amphetamines: POSITIVE — AB
BARBITURATES: NOT DETECTED
BENZODIAZEPINES: NOT DETECTED
Cocaine: NOT DETECTED
Opiates: NOT DETECTED
Tetrahydrocannabinol: NOT DETECTED

## 2015-07-19 LAB — SALICYLATE LEVEL: Salicylate Lvl: <4 mg/dL (ref 2.8–30.0)

## 2015-07-19 LAB — ETHANOL: ALCOHOL ETHYL (B): 197 mg/dL — AB (ref <5)

## 2015-07-19 LAB — ACETAMINOPHEN LEVEL

## 2015-07-19 MED ORDER — BUPROPION HCL ER (XL) 150 MG PO TB24
150.0000 mg | ORAL_TABLET | Freq: Every day | ORAL | Status: DC
  Administered 2015-07-20 – 2015-07-21 (×2): 150 mg via ORAL
  Filled 2015-07-19 (×5): qty 1

## 2015-07-19 MED ORDER — TRAZODONE HCL 100 MG PO TABS
100.0000 mg | ORAL_TABLET | Freq: Every evening | ORAL | Status: DC | PRN
  Administered 2015-07-19 – 2015-07-20 (×2): 100 mg via ORAL
  Filled 2015-07-19: qty 1

## 2015-07-19 MED ORDER — BUPROPION HCL ER (XL) 150 MG PO TB24
150.0000 mg | ORAL_TABLET | Freq: Every day | ORAL | Status: DC
  Administered 2015-07-19: 150 mg via ORAL
  Filled 2015-07-19: qty 1

## 2015-07-19 MED ORDER — ACETAMINOPHEN 325 MG PO TABS: 650.0000 mg | ORAL_TABLET | Freq: Four times a day (QID) | ORAL | Status: DC | PRN

## 2015-07-19 MED ORDER — LORAZEPAM 1 MG PO TABS: 1.0000 mg | ORAL_TABLET | Freq: Four times a day (QID) | ORAL | Status: DC | PRN

## 2015-07-19 MED ORDER — MAGNESIUM HYDROXIDE 400 MG/5ML PO SUSP: 30.0000 mL | Freq: Every day | ORAL | Status: DC | PRN

## 2015-07-19 MED ORDER — TETANUS-DIPHTH-ACELL PERTUSSIS 5-2.5-18.5 LF-MCG/0.5 IM SUSP
0.5000 mL | Freq: Once | INTRAMUSCULAR | Status: AC
  Administered 2015-07-19: 0.5 mL via INTRAMUSCULAR
  Filled 2015-07-19: qty 0.5

## 2015-07-19 MED ORDER — TRAZODONE HCL 100 MG PO TABS: 100.0000 mg | ORAL_TABLET | Freq: Every evening | ORAL | Status: DC | PRN

## 2015-07-19 MED ORDER — ACETAMINOPHEN 325 MG PO TABS
650.0000 mg | ORAL_TABLET | Freq: Once | ORAL | Status: AC
  Administered 2015-07-19: 650 mg via ORAL
  Filled 2015-07-19: qty 2

## 2015-07-19 MED ORDER — ALUM & MAG HYDROXIDE-SIMETH 200-200-20 MG/5ML PO SUSP: 30.0000 mL | ORAL | Status: DC | PRN

## 2015-07-19 NOTE — BH Assessment (Signed)
[facility] Assessment Progress Note  Per Thedore Mins, MD, this pt requires psychiatric hospitalization at this time.  Berneice Heinrich, RN, [facility] has assigned pt to [facility] Rm 304-2.  Pt has signed Voluntary Admission and Consent for Treatment, as well as Consent to Release Information to no one, and signed forms have been faxed to [facility].  Pt's nurse, Marylu Lund, has been notified, and agrees to send original paperwork along with pt via Juel Burrow, and to call report to [phone].  Doylene Canning, MA Triage Specialist [phone]

## 2015-07-19 NOTE — ED Notes (Signed)
MD at bedside. 
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
## 2015-07-19 NOTE — Tx Team (Signed)
Initial Interdisciplinary Treatment Plan   PATIENT STRESSORS: Health problems Marital or family conflict Substance abuse   PATIENT STRENGTHS: Capable of independent living Communication skills Supportive family/friends   PROBLEM LIST: Problem List/Patient Goals Date to be addressed Date deferred Reason deferred Estimated date of resolution  Alcohol abuse 07/19/2015     Depresssion 07/19/2015     Suicidal Ideation 07/19/2015     Marital discord 07/19/2015                                    DISCHARGE CRITERIA:  Improved stabilization in mood, thinking, and/or behavior Need for constant or close observation no longer present Withdrawal symptoms are absent or subacute and managed without 24-hour nursing intervention  PRELIMINARY DISCHARGE PLAN: Return to previous living arrangement Return to previous work or school arrangements  PATIENT/FAMIILY INVOLVEMENT: This treatment plan has been presented to and reviewed with the patient, Curtis Moreno.  The patient and family have been given the opportunity to ask questions and make suggestions.  Cranford Mon 07/19/2015, 6:39 PM
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
## 2015-07-19 NOTE — ED Notes (Signed)
Pt has been changed into scrubs and wanded by security.  Pt has pants, shirt, shoes and socks.  Pt gave ring and necklace to family member.

## 2015-07-19 NOTE — ED Notes (Signed)
Shorts, shirt, hoody and socks.  Pt has been seen and wanded by security.

## 2015-07-19 NOTE — Progress Notes (Signed)
Pt attended the evening AA speaker meeting. 
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
## 2015-07-19 NOTE — ED Notes (Signed)
TTS in room with pt  

## 2015-07-20 ENCOUNTER — Encounter (HOSPITAL_COMMUNITY): Payer: Self-pay | Admitting: Psychiatry

## 2015-07-20 DIAGNOSIS — F1020 Alcohol dependence, uncomplicated: Secondary | ICD-10-CM | POA: Diagnosis present

## 2015-07-20 DIAGNOSIS — F332 Major depressive disorder, recurrent severe without psychotic features: Secondary | ICD-10-CM

## 2015-07-20 DIAGNOSIS — F1024 Alcohol dependence with alcohol-induced mood disorder: Principal | ICD-10-CM

## 2015-07-20 MED ORDER — AMPHETAMINE-DEXTROAMPHETAMINE 10 MG PO TABS
20.0000 mg | ORAL_TABLET | Freq: Every day | ORAL | Status: DC
  Administered 2015-07-20 – 2015-07-21 (×2): 20 mg via ORAL
  Filled 2015-07-20 (×2): qty 2

## 2015-07-20 MED ORDER — BACITRACIN-NEOMYCIN-POLYMYXIN 400-5-5000 EX OINT
TOPICAL_OINTMENT | Freq: Two times a day (BID) | CUTANEOUS | Status: DC
  Administered 2015-07-20 – 2015-07-21 (×2): 1 via TOPICAL
  Filled 2015-07-20: qty 1
  Filled 2015-07-20: qty 2

## 2015-07-20 MED ORDER — BACITRACIN 500 UNIT/GM EX OINT
TOPICAL_OINTMENT | Freq: Two times a day (BID) | CUTANEOUS | Status: DC
  Filled 2015-07-20 (×2): qty 1

## 2015-07-20 MED ORDER — BACITRACIN-NEOMYCIN-POLYMYXIN 400-5-5000 EX OINT: TOPICAL_OINTMENT | Freq: Two times a day (BID) | CUTANEOUS | Status: DC

## 2015-07-20 NOTE — Plan of Care (Signed)
Problem: Alteration in mood Goal: LTG-Pt's behavior demonstrates decreased signs of depression (Patient's behavior demonstrates decreased signs of depression to the point the patient is safe to return home and continue treatment in an outpatient setting)  Outcome: Progressing Pt stated his depression is not as bad.
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
## 2015-07-20 NOTE — Progress Notes (Signed)
Pt attended NA group this evening.  

## 2015-07-20 NOTE — Progress Notes (Signed)
D. Pt present with flat and depressed mood and a sense of hopelessness. Pt stated " I did not mean for all these to happen, I love my kids." Pt blame his being here on alcohol. Pt stated he had a good night sleep last night, good appetite, normal energy, and good concentration. Pt stated his goal for today is " accept that wife is leaving me and taking kids." Pt denies physical pain, took his medication as scheduled. Pt reported that his depression was a 0, his hopelessness was a 3, and that his anxiety was a 0. Pt reported being negative SI/HI, no AH/VH noted. A: 15 min checks continued for patient safety. R: Pts safety maintained.

## 2015-07-20 NOTE — Progress Notes (Signed)
Patient ID: Curtis Moreno, male   DOB: January 02, 1981, 34 y.o.   MRN: [id number] D: Patient pleasant on approach. Pt reports about marital issues, finances and pending divorce. reports there is a possibility he might lose his home. mood and affect appeared depressed and flat. Pt reports he "feels I'm losing my mind and everything I have work for". Pt denies SI/HI/AVH and pain. Pt attended and participated in evening wrap up group. Cooperative with assessment.   A: Met with pt 1:1. Medications administered as prescribed. Support and encouragement provided to attend groups and engage in milieu. Pt encouraged to discuss feelings and come to staff with any question or concerns.   R: Patient remains safe and complaint with medications.
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
## 2015-07-20 NOTE — Plan of Care (Signed)
Problem: Diagnosis: Increased Risk For Suicide Attempt Goal: STG-Patient Will Attend All Groups On The Unit Outcome: Progressing Pt attended evening AA group     

## 2015-07-20 NOTE — BHH Group Notes (Signed)
[facility] LCSW Aftercare Discharge Planning Group Note   07/20/2015 10:33 AM  Participation Quality:  Appropriate   Mood/Affect:  Appropriate  Depression Rating:  10  Anxiety Rating:  10  Thoughts of Suicide:  No Will you contract for safety?   NA  Current AVH:  No  Plan for Discharge/Comments:  Pt reports that his wife wants divorce and is cheating on him. "I was drunk and upset and ended up here." Pt reports that he doesn't think he has issue with drinking. His goal is to make plan to get his life together and return to work. Pt denies SI/HI/AVH and denies withdrawal symptoms. Pt wants to continue seeing Dr. Merla Riches for med management and is open to therapy.   Transportation Means: unknown at this time.   Supports: limited family supports   Smart, Oncologist

## 2015-07-20 NOTE — BHH Suicide Risk Assessment (Signed)
[facility] Admission Suicide Risk Assessment   Nursing information obtained from:    Demographic factors:    Current Mental Status:    Loss Factors:    Historical Factors:    Risk Reduction Factors:    Total Time spent with patient: 45 minutes Principal Problem: Alcohol dependence with alcohol-induced mood disorder Diagnosis:   Patient Active Problem List   Diagnosis Date Noted  . Alcohol use disorder, severe, dependence [F10.20] 07/20/2015  . Alcohol dependence with alcohol-induced mood disorder [F10.24] 07/20/2015  . Major depressive disorder, recurrent, severe without psychotic features [F33.2] 07/19/2015  . Tobacco use disorder [Z72.0] 05/13/2015  . 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  World Science writer [facility]). Score between 0-7:  no or low risk or alcohol related problems. Score between 8-15:  moderate risk of alcohol related problems. Score between 16-19:  high risk of alcohol related problems. Score 20 or above:  warrants further diagnostic evaluation for alcohol dependence and treatment.   CLINICAL FACTORS:   Depression:   Comorbid alcohol abuse/dependence Alcohol/Substance Abuse/Dependencies  Psychiatric Specialty Exam: Physical Exam  ROS  Blood pressure 97/69, pulse 82, temperature 97.6 F (36.4 C), temperature source Oral, resp. rate 16, height  (1.905 m), weight 77.111 kg (170 lb).Body mass index is 21.25 kg/(m^2).    COGNITIVE FEATURES THAT CONTRIBUTE TO RISK:  Closed-mindedness, Polarized thinking and Thought constriction (tunnel vision)    SUICIDE RISK:   Moderate:  Frequent suicidal ideation with limited intensity, and duration, some specificity in terms of plans, no associated intent, good self-control, limited dysphoria/symptomatology, some risk factors  present, and identifiable protective factors, including available and accessible social support.  PLAN OF CARE: See Admission H and PE  Medical Decision Making:  Review of Psycho-Social Stressors (1), Review or order clinical lab tests (1), Review of Medication Regimen & Side Effects (2) and Review of New Medication or Change in Dosage (2)  I certify that inpatient services furnished can reasonably be expected to improve the patient's condition.   LUGO,IRVING A 07/20/2015, 5:36 PM

## 2015-07-20 NOTE — B[facility] Group Notes (Signed)
[facility] LCSW Group Therapy  07/20/2015 1:08 PM  Type of Therapy:  Group Therapy  Participation Level:  Did Not Attend-pt chose to remain in bed.   Modes of Intervention:  Discussion, Education, Exploration, Problem-solving, Rapport Building, Socialization and Support  Summary of Progress/Problems: Today's Topic: Overcoming Obstacles. Patients identified one short term goal and potential obstacles in reaching this goal. Patients processed barriers involved in overcoming these obstacles. Patients identified steps necessary for overcoming these obstacles and explored motivation (internal and external) for facing these difficulties head on.    Smart, Heather LCSWA 07/20/2015, 1:08 PM

## 2015-07-21 MED ORDER — ACETAMINOPHEN 500 MG PO TABS: 500.0000 mg | ORAL_TABLET | Freq: Four times a day (QID) | ORAL | Status: DC | PRN

## 2015-07-21 MED ORDER — AMPHETAMINE-DEXTROAMPHETAMINE 20 MG PO TABS: 20.0000 mg | ORAL_TABLET | Freq: Every day | ORAL | Status: DC

## 2015-07-21 MED ORDER — BUPROPION HCL ER (XL) 150 MG PO TB24: 150.0000 mg | ORAL_TABLET | Freq: Every day | ORAL | Status: DC

## 2015-07-21 MED ORDER — TRAZODONE HCL 100 MG PO TABS: 100.0000 mg | ORAL_TABLET | Freq: Every evening | ORAL | Status: DC | PRN

## 2015-07-21 MED ORDER — BACITRACIN-NEOMYCIN-POLYMYXIN 400-5-5000 EX OINT: TOPICAL_OINTMENT | Freq: Two times a day (BID) | CUTANEOUS | Status: DC
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
## 2015-07-21 NOTE — Progress Notes (Signed)
D: Pt was pleasant. Informed the writer that he was "embarassed" during group. Stated that he "dozed off". Pt states he usually takes adderall twice daily to help him with energy. Stated that he had a "good day". Pt voiced no other questions or complaints.  A: Informed pt that his concerns would be passed on the his dr.   Joanna Puff and encouragement was offered. 15 min checks continued for safety.  R: Pt remains safe.

## 2015-07-21 NOTE — Progress Notes (Signed)
  [facility] Adult Case Management Discharge Plan :  Will you be returning to the same living situation after discharge:  Yes,  home At discharge, do you have transportation home?: Yes,  wife Do you have the ability to pay for your medications: Yes,  [employer]  Release of information consent forms completed and submitted to medical records by CSW.  Patient to Follow up at: Follow-up Information    Follow up with [facility] On 07/27/2015.   Why:  Appt on this date at 4:00PM with Deliah Boston for medication management/hospital follow-up. (Dr. Merla Riches is not in the office for the next few weeks).     Contact information:   ATTN: Dr. Leodis Sias [address] [place], [place] [address] Phone: [phone] Fax: [phone]      Follow up with [facility].   Why:  Patient instructed to call to schedule his appt prior to discharge. Credit card needed on file-give 2 days before 1st appt. $100 charge for late cancellation or no show.    Contact information:   ATTN: [facility] [address] [place], [place] [address] Phone: [phone] Fax: [phone]      Patient denies SI/HI: Yes,  during group/self report.     Safety Planning and Suicide Prevention discussed: Yes,  SPE completed with pt. Contact attempts made with pt's friend. pt did not consent to calling wife, with whom he recently separated. Pt provided with SPI pamphlet and encouraged to share this information with support network, ask questions, and talk about any concerns relating to SPE> Pt also given Mobile crisis information.   Have you used any form of tobacco in the last 30 days? (Cigarettes, Smokeless Tobacco, Cigars, and/or Pipes): Yes  Has patient been referred to the Quitline?: Patient refused referral  Curtis Moreno, Curtis Moreno  07/21/2015, 11:10 AM

## 2015-07-21 NOTE — [facility] Suicide Risk Assessment (Signed)
[facility] Discharge Suicide Risk Assessment   Demographic Factors:  Male and Caucasian  Total Time spent with patient: 30 minutes  Musculoskeletal: Strength & Muscle Tone: within normal limits Gait & Station: normal Patient leans: normal  Psychiatric Specialty Exam: Physical Exam  Review of Systems  Constitutional: Negative.   HENT: Positive for tinnitus.   Eyes: Negative.   Respiratory: Negative.   Cardiovascular: Negative.   Gastrointestinal: Negative.   Genitourinary: Negative.   Musculoskeletal: Negative.   Skin: Negative.   Neurological: Negative.   Endo/Heme/Allergies: Negative.   Psychiatric/Behavioral: Positive for depression and substance abuse.    Blood pressure 94/58, pulse 70, temperature 97.7 F (36.5 C), temperature source Oral, resp. rate 16, height  (1.905 m), weight 77.111 kg (170 lb).Body mass index is 21.25 kg/(m^2).  General Appearance: Fairly Groomed  Patent attorney::  Fair  Speech:  Clear and Coherent409  Volume:  Normal  Mood:  Euthymic  Affect:  Appropriate  Thought Process:  Coherent and Goal Directed  Orientation:  Full (Time, Place, and Person)  Thought Content:  plans as he moves on, relapse prevention plan  Suicidal Thoughts:  No  Homicidal Thoughts:  No  Memory:  Immediate;   Fair Recent;   Fair Remote;   Fair  Judgement:  Fair  Insight:  Present  Psychomotor Activity:  Normal  Concentration:  Fair  Recall:  [employer] of Knowledge:Fair  Language: Fair  Akathisia:  No  Handed:  Right  AIMS (if indicated):     Assets:  Desire for Improvement Social Support Vocational/Educational  Sleep:  Number of Hours: 5.5  Cognition: WNL  ADL's:  Intact   Have you used any form of tobacco in the last 30 days? (Cigarettes, Smokeless Tobacco, Cigars, and/or Pipes): Yes  Has this patient used any form of tobacco in the last 30 days? (Cigarettes, Smokeless Tobacco, Cigars, and/or Pipes) Yes, A prescription for an FDA-approved tobacco cessation  medication was offered at discharge and the patient refused  Mental Status Per Nursing Assessment::   On Admission:     Current Mental Status by Physician: In full contact with reality. There are no active S/S of withdrawal. There are no active SI plans or intent. He states he is ready to work on achieving long term sobriety. He is aware that every time something goes wrong in his life, alcohol is involved. He states he has realized he needs to move on and not be hoping that they can get together again. States that he knows that it is not a healthy relationship. He has found out that he has a lot of support if he was to ask for it. He states he knows he is going to be facing a lot of stressful times as he goes to court for the DWI  Loss Factors: Loss of significant relationship and Legal issues  Historical Factors: NA  Risk Reduction Factors:   Sense of responsibility to family, Employed and Positive social support  Continued Clinical Symptoms:  Depression:   Comorbid alcohol abuse/dependence Alcohol/Substance Abuse/Dependencies  Cognitive Features That Contribute To Risk:  None    Suicide Risk:  Minimal: No identifiable suicidal ideation.  Patients presenting with no risk factors but with morbid ruminations; may be classified as minimal risk based on the severity of the depressive symptoms  Principal Problem: Alcohol dependence with alcohol-induced mood disorder Discharge Diagnoses:  Patient Active Problem List   Diagnosis Date Noted  . Alcohol use disorder, severe, dependence [F10.20] 07/20/2015  . Alcohol  dependence with alcohol-induced mood disorder [F10.24] 07/20/2015  . Major depressive disorder, recurrent, severe without psychotic features [F33.2] 07/19/2015  . Tobacco use disorder [Z72.0] 05/13/2015  . 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   Why:  Appt on this date at  4:00PM with Deliah Boston for medication management/hospital follow-up. (Dr. Merla Riches is not in the office for the next few weeks).     Contact information:   ATTN: Dr. Leodis Sias [address] [place], [place] [address] Phone: [phone] Fax: [phone]      Follow up with [facility].   Why:  Patient instructed to call to schedule his appt prior to discharge. Credit card needed on file-give 2 days before 1st appt. $100 charge for late cancellation or no show.    Contact information:   ATTN: [facility] [address] [place], [place] [address] Phone: [phone] Fax: [phone]      Plan Of Care/Follow-up recommendations:  Activity:  as tolerated Diet:  regular Follow up as above Is patient on multiple antipsychotic therapies at discharge:  No   Has Patient had three or more failed trials of antipsychotic monotherapy by history:  No  Recommended Plan for Multiple Antipsychotic Therapies: NA    LUGO,IRVING A 07/21/2015, 12:36 PM

## 2015-07-21 NOTE — [facility] Suicide Risk Assessment (Signed)
[facility] INPATIENT:  Family/Significant Other Suicide Prevention Education  Suicide Prevention Education:  Contact Attempts: Miss Steward Drone (pt's friend) [phone] has been identified by the patient as the family member/significant other with whom the patient will be residing, and identified as the person(s) who will aid the patient in the event of a mental health crisis.  With written consent from the patient, two attempts were made to provide suicide prevention education, prior to and/or following the patient's discharge.  We were unsuccessful in providing suicide prevention education.  A suicide education pamphlet was given to the patient to share with family/significant other.  Date and time of first attempt: 07/20/15 at 10:00AM Date and time of second attempt: 07/21/15 at 11:05AM (voicemail left requesting call back).   Smart, Heather LCSWA  07/21/2015, 11:09 AM

## 2015-07-21 NOTE — Progress Notes (Signed)
Pt discharged home with the wife and two kids. Pt was ambulatory and well at the time of discharge, All papers and prescriptions were given and valuables from the locker returned. Pt's belonging at the bedside were missing, pt said it was ok, he would just go home without them since he did not want to keep his ride waiting. [place]  understanding expressed. Denies SI/HI and A/VH. Pt given opportunity to express concerns and asked questions.

## 2015-07-22 MED ORDER — BACITRACIN-NEOMYCIN-POLYMYXIN 400-5-5000 EX OINT: TOPICAL_OINTMENT | Freq: Two times a day (BID) | CUTANEOUS | Status: DC

## 2015-07-22 NOTE — Progress Notes (Signed)
Patient ID: Curtis Moreno, male   DOB: Dec 11, 1980, 34 y.o.   MRN: [id number]  Pt notified of risk managements number per [facility]. Explained that if pt wished to make a claim about lost personal items, pt could contact them. Pt states "Oh no, its fine. I don't want to." Pt thanks Clinical research associate for calling.

## 2015-07-27 ENCOUNTER — Ambulatory Visit (INDEPENDENT_AMBULATORY_CARE_PROVIDER_SITE_OTHER): Payer: PRIVATE HEALTH INSURANCE | Admitting: Physician Assistant

## 2015-07-27 VITALS — BP 130/73 | HR 84 | Temp 98.3°F | Resp 16 | Ht 75.0 in | Wt 173.0 lb

## 2015-07-27 DIAGNOSIS — G4452 New daily persistent headache (NDPH): Secondary | ICD-10-CM | POA: Diagnosis not present

## 2015-07-27 DIAGNOSIS — H9313 Tinnitus, bilateral: Secondary | ICD-10-CM | POA: Diagnosis not present

## 2015-07-27 DIAGNOSIS — Z113 Encounter for screening for infections with a predominantly sexual mode of transmission: Secondary | ICD-10-CM | POA: Diagnosis not present

## 2015-07-27 DIAGNOSIS — Z09 Encounter for follow-up examination after completed treatment for conditions other than malignant neoplasm: Secondary | ICD-10-CM

## 2015-07-27 LAB — COMPLETE METABOLIC PANEL WITH GFR
ALT: 15 U/L (ref 9–46)
AST: 35 U/L (ref 10–40)
Albumin: 4.7 g/dL (ref 3.6–5.1)
Alkaline Phosphatase: 47 U/L (ref 40–115)
BILIRUBIN TOTAL: 0.8 mg/dL (ref 0.2–1.2)
BUN: 15 mg/dL (ref 7–25)
CO2: 26 mmol/L (ref 20–31)
CREATININE: 0.89 mg/dL (ref 0.60–1.35)
Calcium: 9.1 mg/dL (ref 8.6–10.3)
Chloride: 103 mmol/L (ref 98–110)
GFR, Est African American: >89 mL/min (ref >=60)
GFR, Est Non African American: >89 mL/min (ref >=60)
GLUCOSE: 92 mg/dL (ref 65–99)
Potassium: 4.2 mmol/L (ref 3.5–5.3)
Sodium: 140 mmol/L (ref 135–146)
TOTAL PROTEIN: 6.9 g/dL (ref 6.1–8.1)

## 2015-07-27 LAB — CBC
HCT: 40.2 % (ref 39.0–52.0)
Hemoglobin: 13.9 g/dL (ref 13.0–17.0)
MCH: 31.4 pg (ref 26.0–34.0)
MCHC: 34.6 g/dL (ref 30.0–36.0)
MCV: 91 fL (ref 78.0–100.0)
MPV: 10.8 fL (ref 8.6–12.4)
Platelets: 196 10*3/uL (ref 150–400)
RBC: 4.42 MIL/uL (ref 4.22–5.81)
RDW: 12.7 % (ref 11.5–15.5)
WBC: 9.3 10*3/uL (ref 4.0–10.5)

## 2015-07-27 LAB — TSH: TSH: 1.031 u[IU]/mL (ref 0.350–4.500)

## 2015-07-27 MED ORDER — LORATADINE-PSEUDOEPHEDRINE ER 10-240 MG PO TB24: 1.0000 | ORAL_TABLET | Freq: Every day | ORAL | Status: DC
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
## 2015-07-28 ENCOUNTER — Telehealth: Payer: Self-pay

## 2015-07-28 LAB — RPR

## 2015-07-28 LAB — SEDIMENTATION RATE: Sed Rate: 1 mm/hr (ref 0–15)

## 2015-07-28 LAB — HIV ANTIBODY (ROUTINE TESTING W REFLEX): HIV: NONREACTIVE

## 2015-07-28 NOTE — Telephone Encounter (Signed)
Patient was prescribed claritin  and the pharmacy doesn't carry it. He wants to get claritin over the counter but they only come in  tablets. Please advise!

## 2015-07-29 NOTE — Telephone Encounter (Signed)
Pt was Rxd claritin-D, which is /240mg . The 10 mg referred to strength of the claritin so it is the same as OTC 10 mg. If pt wants decongestant also he can get the Claritin-D OTC. Called pt and advised.

## 2015-08-02 ENCOUNTER — Telehealth: Payer: Self-pay

## 2015-08-02 NOTE — Telephone Encounter (Signed)
PATIENT WOULD LIKE DR. DOOLITTLE TO KNOW THAT IT IS TIME TO PICK UP HIS PRESCRIPTION FOR ADDERALL. PLEASE CALL HIM WHEN IT IS READY TO BE PICKED UP. BEST PHONE [phone] (CELL)  MBC

## 2015-08-04 ENCOUNTER — Encounter: Payer: Self-pay | Admitting: Neurology

## 2015-08-04 ENCOUNTER — Ambulatory Visit (INDEPENDENT_AMBULATORY_CARE_PROVIDER_SITE_OTHER): Payer: PRIVATE HEALTH INSURANCE | Admitting: Neurology

## 2015-08-04 VITALS — BP 106/68 | HR 61 | Ht 75.0 in | Wt 175.0 lb

## 2015-08-04 DIAGNOSIS — R519 Headache, unspecified: Secondary | ICD-10-CM

## 2015-08-04 DIAGNOSIS — R51 Headache: Secondary | ICD-10-CM

## 2015-08-04 DIAGNOSIS — H9319 Tinnitus, unspecified ear: Secondary | ICD-10-CM | POA: Diagnosis not present

## 2015-08-04 DIAGNOSIS — S060X1D Concussion with loss of consciousness of 30 minutes or less, subsequent encounter: Secondary | ICD-10-CM

## 2015-08-04 MED ORDER — SUMATRIPTAN SUCCINATE 50 MG PO TABS: 50.0000 mg | ORAL_TABLET | ORAL | Status: DC | PRN
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
## 2015-08-05 MED ORDER — AMPHETAMINE-DEXTROAMPHETAMINE 20 MG PO TABS: 20.0000 mg | ORAL_TABLET | Freq: Every day | ORAL | Status: DC

## 2015-08-05 NOTE — Telephone Encounter (Signed)
Pt. Notified.

## 2015-08-05 NOTE — Telephone Encounter (Signed)
Refill provided as per Dr. Netta Corriganoolittle's instructions.

## 2015-08-05 NOTE — Telephone Encounter (Signed)
Back Monday pm Pa could sign before that--M Chestine SporeClark has seen him as patient

## 2015-08-09 ENCOUNTER — Telehealth: Payer: Self-pay | Admitting: Family Medicine

## 2015-08-09 NOTE — Telephone Encounter (Signed)
lmom of patients new time on 09/28/15 which is 4 instead of 4:15

## 2015-09-19 ENCOUNTER — Encounter: Payer: Self-pay | Admitting: Internal Medicine

## 2015-09-28 ENCOUNTER — Encounter: Payer: Self-pay | Admitting: Physician Assistant

## 2015-09-28 ENCOUNTER — Ambulatory Visit (INDEPENDENT_AMBULATORY_CARE_PROVIDER_SITE_OTHER): Payer: PRIVATE HEALTH INSURANCE | Admitting: Physician Assistant

## 2015-09-28 VITALS — BP 112/66 | HR 81 | Temp 98.2°F | Resp 16 | Ht 74.75 in | Wt 173.2 lb

## 2015-09-28 DIAGNOSIS — J320 Chronic maxillary sinusitis: Secondary | ICD-10-CM

## 2015-09-28 DIAGNOSIS — G47419 Narcolepsy without cataplexy: Secondary | ICD-10-CM | POA: Diagnosis not present

## 2015-09-28 MED ORDER — AMOXICILLIN-POT CLAVULANATE 875-125 MG PO TABS: 1.0000 | ORAL_TABLET | Freq: Two times a day (BID) | ORAL | Status: DC

## 2015-09-28 NOTE — Progress Notes (Signed)
   10/01/2015 7:49 PM   DOB: Jan 21, 1981 / MRN: [id number]  SUBJECTIVE:  Curtis Moreno is a 34 y.o. male presenting for follow up of sinusitis now with facial pain and ear pressure.  Reports his HAs are now better but his tinnitus has been persistent.  He has a history of sinus infection and thinks this may have recurred.    He was diagnosed with narcolepsy at age 34 by Dr. Merla Richesoolittle, he was sent for a sleep study at that time and this was confirmed. He was tried on several types of medication but none worked, and adderal was found to help his symptoms. He has had not symptoms since starting the adderall.       He has No Known Allergies.   He  has a past medical history of Allergy; Narcolepsy; Chronic sinusitis; head injury; and Headache.    He  reports that he has been smoking.  He does not have any smokeless tobacco history on file. He reports that he drinks about 1.2 oz of alcohol per week. He reports that he uses illicit drugs (Marijuana). He  reports that he currently engages in sexual activity. The patient  has past surgical history that includes Hernia repair and Patellar tendon repair.  His family history includes Bipolar disorder in his mother.  Review of Systems  Constitutional: Negative for fever, chills, malaise/fatigue and diaphoresis.  HENT: Positive for congestion. Negative for sore throat.   Respiratory: Positive for cough. Negative for hemoptysis, shortness of breath and wheezing.   Cardiovascular: Negative for chest pain.  Gastrointestinal: Negative for nausea.  Skin: Negative for rash.  Neurological: Negative for dizziness and weakness.  Endo/Heme/Allergies: Negative for polydipsia.    Problem list and medications reviewed and updated by myself where necessary, and exist elsewhere in the encounter.   OBJECTIVE:  BP 112/66 mmHg  Pulse 81  Temp(Src) 98.2 F (36.8 C) (Oral)  Resp 16  Ht 6' 2.75" (1.899 m)  Wt 173 lb 3.2 oz (78.563 kg)  BMI 21.79 kg/m2 CrCl cannot  be calculated (Patient has no serum creatinine result on file.).  Physical Exam  No results found for this or any previous visit (from the past 48 hour(s)).  ASSESSMENT AND PLAN  Curtis Moreno was seen today for follow-up and tinnitus.  Diagnoses and all orders for this visit:  Chronic maxillary sinusitis: Previous CT scan showing mucosal sinus mucosal thickening, however sed rate was negative.  Will try him on ten days of augmentin and if he sees improvement he will complete one month of therapy.         -     amoxicillin-clavulanate (AUGMENTIN) 875-125 MG tablet; Take 1 tablet by mouth 2 (two) times daily.  Narcolepsy: It has been over ten years since this problem was evaluated.  Will send to Dr. Charlynn Grimesoemier in sleep medicine for a recheck.  Appreciate her recommendations.         The patient was advised to call or return to clinic if he does not see an improvement in symptoms or to seek the care of the closest emergency department if he worsens with the above plan.   Deliah BostonMichael Will Heinkel, MHS, PA-C [facility] 10/01/2015 7:49 PM

## 2015-10-06 ENCOUNTER — Ambulatory Visit: Payer: 59 | Attending: Internal Medicine

## 2015-10-14 ENCOUNTER — Telehealth: Payer: Self-pay | Admitting: Physician Assistant

## 2015-10-14 NOTE — Telephone Encounter (Signed)
The patient is experiencing back and flank pain; just asked that Mr. Curtis Moreno was advised.   [phone]

## 2015-10-15 NOTE — Telephone Encounter (Signed)
He needs to come in.  Deliah BostonMichael Clark, MS, PA-C 8:29 AM, 10/15/2015

## 2015-10-18 NOTE — Telephone Encounter (Signed)
Called pt and advised message from provider on their voicemail.  

## 2015-11-03 ENCOUNTER — Institutional Professional Consult (permissible substitution): Payer: PRIVATE HEALTH INSURANCE | Admitting: Neurology

## 2015-11-03 ENCOUNTER — Telehealth: Payer: Self-pay

## 2015-11-03 NOTE — Telephone Encounter (Signed)
Pt did not show for their appt with Dr. Dohmeier today.  

## 2015-11-04 ENCOUNTER — Encounter: Payer: Self-pay | Admitting: Neurology

## 2016-01-12 ENCOUNTER — Ambulatory Visit (INDEPENDENT_AMBULATORY_CARE_PROVIDER_SITE_OTHER): Payer: PRIVATE HEALTH INSURANCE | Admitting: Neurology

## 2016-01-12 ENCOUNTER — Encounter: Payer: Self-pay | Admitting: Neurology

## 2016-01-12 ENCOUNTER — Telehealth: Payer: Self-pay

## 2016-01-12 VITALS — BP 112/70 | HR 62 | Resp 20 | Ht 75.0 in | Wt 174.0 lb

## 2016-01-12 DIAGNOSIS — G4719 Other hypersomnia: Secondary | ICD-10-CM

## 2016-01-12 DIAGNOSIS — G44041 Chronic paroxysmal hemicrania, intractable: Secondary | ICD-10-CM

## 2016-01-12 DIAGNOSIS — G47411 Narcolepsy with cataplexy: Secondary | ICD-10-CM

## 2016-01-12 DIAGNOSIS — G4726 Circadian rhythm sleep disorder, shift work type: Secondary | ICD-10-CM | POA: Diagnosis not present

## 2016-01-12 MED ORDER — INDOMETHACIN 25 MG PO CAPS: 25.0000 mg | ORAL_CAPSULE | Freq: Two times a day (BID) | ORAL | Status: DC

## 2016-01-12 NOTE — Telephone Encounter (Signed)
[facility] Received a receipt of confirmation.
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
## 2016-01-12 NOTE — Patient Instructions (Signed)
Hypersomnia Hypersomnia is when you feel extremely tired during the day even though you're getting plenty of sleep at night. You may need to take naps during the day, and you may also be extremely difficult to wake up when you are sleeping.  CAUSES  The cause of your hypersomnia may not be known. Hypersomnia may be caused by:   Medicines.  Sleep disorders, such as narcolepsy.  Trauma or injury to your head or nervous system.  Using drugs or alcohol.  Tumors.  Medical conditions, such as depression or hypothyroidism.  Genetics. SIGNS AND SYMPTOMS  The main symptoms of hypersomnia include:   Feeling extremely tired throughout the day.  Being very difficult to wake up.  Sleeping for longer and longer periods.  Taking naps throughout the day. Other symptoms may include:   Feeling:  Restless.  Annoyed.  Anxious.  Low energy.  Having difficulty:  Remembering.  Speaking.  Thinking.  Losing your appetite.  Experiencing hallucinations. DIAGNOSIS  Hypersomnia may be diagnosed by:  Medical history and physical exam. This will include a sleep history.  Completing sleep logs.  Tests may also be done, such as:  Polysomnography.  Multiple sleep latency test (MSLT). TREATMENT  There is no cure for hypersomnia, but treatment can be very effective in helping manage the condition. Treatment may include:  Lifestyle and sleeping strategies to help cope with the condition.  Stimulant medicines.  Treating any underlying causes of hypersomnia. HOME CARE INSTRUCTIONS  Take medicines only as directed by your health care provider.  Schedule short naps for when you feel sleepiest during the day. Tell your employer or teachers that you have hypersomnia. You may be able to adjust your schedule to include time for naps.  Avoid drinking alcohol or caffeinated beverages.  Do not eat a heavy meal before bedtime. Eat at about the same times every day.  Do not drive or  operate heavy machinery if you are sleepy.  Do not swim or go out on the water without a life jacket.  If possible, adjust your schedule so that you do not have to work or be active at night.  Keep all follow-up visits as directed by your health care provider. This is important. SEEK MEDICAL CARE IF:   You have new symptoms.  Your symptoms get worse. SEEK IMMEDIATE MEDICAL CARE IF:  You have serious thoughts of hurting yourself or someone else.   This information is not intended to replace advice given to you by your health care provider. Make sure you discuss any questions you have with your health care provider.   Document Released: 09/28/2002 Document Revised: 10/29/2014 Document Reviewed: 05/13/2014 Elsevier Interactive Patient Education 2016 [employer]  

## 2016-01-16 ENCOUNTER — Encounter: Payer: Self-pay | Admitting: *Deleted

## 2016-03-05 ENCOUNTER — Encounter: Payer: Self-pay | Admitting: Neurology

## 2016-03-05 ENCOUNTER — Ambulatory Visit (INDEPENDENT_AMBULATORY_CARE_PROVIDER_SITE_OTHER): Payer: PRIVATE HEALTH INSURANCE | Admitting: Neurology

## 2016-03-05 VITALS — BP 123/9 | HR 91 | Ht 75.0 in | Wt 168.5 lb

## 2016-03-05 DIAGNOSIS — H532 Diplopia: Secondary | ICD-10-CM

## 2016-03-05 DIAGNOSIS — G47411 Narcolepsy with cataplexy: Secondary | ICD-10-CM | POA: Diagnosis not present

## 2016-03-05 DIAGNOSIS — G4726 Circadian rhythm sleep disorder, shift work type: Secondary | ICD-10-CM | POA: Diagnosis not present

## 2016-03-05 DIAGNOSIS — H02401 Unspecified ptosis of right eyelid: Secondary | ICD-10-CM

## 2016-03-05 DIAGNOSIS — G44041 Chronic paroxysmal hemicrania, intractable: Secondary | ICD-10-CM

## 2016-03-05 DIAGNOSIS — G44229 Chronic tension-type headache, not intractable: Secondary | ICD-10-CM

## 2016-03-05 DIAGNOSIS — R519 Headache, unspecified: Secondary | ICD-10-CM | POA: Insufficient documentation

## 2016-03-05 DIAGNOSIS — G4719 Other hypersomnia: Secondary | ICD-10-CM

## 2016-03-05 DIAGNOSIS — R51 Headache: Secondary | ICD-10-CM

## 2016-03-05 MED ORDER — INDOMETHACIN 25 MG PO CAPS: 25.0000 mg | ORAL_CAPSULE | Freq: Two times a day (BID) | ORAL | Status: DC | PRN

## 2016-03-05 MED ORDER — NORTRIPTYLINE HCL 25 MG PO CAPS
ORAL_CAPSULE | ORAL | Status: DC
Start: 2016-03-05 — End: 2016-04-18
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
## 2016-03-06 LAB — THYROID PANEL WITH TSH
Free Thyroxine Index: 2.4 (ref 1.2–4.9)
T3 UPTAKE RATIO: 27 % (ref 24–39)
T4 TOTAL: 8.9 ug/dL (ref 4.5–12.0)
TSH: 1.28 u[IU]/mL (ref 0.450–4.500)

## 2016-03-12 ENCOUNTER — Telehealth: Payer: Self-pay | Admitting: Neurology

## 2016-03-12 NOTE — Telephone Encounter (Signed)
Pt called in to schedule MRI. Please call back

## 2016-03-13 NOTE — Telephone Encounter (Signed)
Patient called back about scheduling MRI. Please call.

## 2016-03-13 NOTE — Telephone Encounter (Signed)
Returned patients call and left a VM asking him to return my call.

## 2016-03-16 ENCOUNTER — Telehealth: Payer: Self-pay | Admitting: *Deleted

## 2016-03-16 NOTE — Telephone Encounter (Signed)
Pt called to schedule MRI appt. Pt is frustrated, he is wanting to get it scheduled asap. Pt is aware Duwayne HeckDanielle is out of the office today and clinic is closed on Monday 03/19/16. Thanks

## 2016-03-16 NOTE — Telephone Encounter (Signed)
Message For: DANIELLE             Taken 26-MAY-17 at  9:36AM by [facility] ------------------------------------------------------------ Thomes Lollingaller Jahrell Hengel              CID [id number]  Patient SAME                 Pt's Dr Terrace ArabiaYAN          Area Code 336 Phone# [phone] * DOB [phone]     RE NEEDS TO MAKE HIS MRI APPT,PLEASE CALL                                                                 Disp:Y/N N If Y = C/B If No Response In 20minutes ============================================================

## 2016-03-20 NOTE — Telephone Encounter (Signed)
Called and scheduled patient. Made him aware that there had been an attempt to reach him from our office. He stated that he received our message.

## 2016-03-21 ENCOUNTER — Ambulatory Visit (INDEPENDENT_AMBULATORY_CARE_PROVIDER_SITE_OTHER): Payer: PRIVATE HEALTH INSURANCE

## 2016-03-21 ENCOUNTER — Telehealth: Payer: Self-pay | Admitting: Neurology

## 2016-03-21 DIAGNOSIS — G4719 Other hypersomnia: Secondary | ICD-10-CM | POA: Diagnosis not present

## 2016-03-21 DIAGNOSIS — G47411 Narcolepsy with cataplexy: Secondary | ICD-10-CM | POA: Diagnosis not present

## 2016-03-21 DIAGNOSIS — G44229 Chronic tension-type headache, not intractable: Secondary | ICD-10-CM

## 2016-03-21 DIAGNOSIS — G44041 Chronic paroxysmal hemicrania, intractable: Secondary | ICD-10-CM

## 2016-03-21 DIAGNOSIS — H532 Diplopia: Secondary | ICD-10-CM | POA: Diagnosis not present

## 2016-03-21 DIAGNOSIS — G4726 Circadian rhythm sleep disorder, shift work type: Secondary | ICD-10-CM | POA: Diagnosis not present

## 2016-03-21 DIAGNOSIS — H02401 Unspecified ptosis of right eyelid: Secondary | ICD-10-CM

## 2016-03-21 MED ORDER — AMPHETAMINE-DEXTROAMPHETAMINE 20 MG PO TABS: 40.0000 mg | ORAL_TABLET | Freq: Every day | ORAL | Status: DC

## 2016-03-21 NOTE — Telephone Encounter (Signed)
Primary neurologist is Dr. Terrace ArabiaYan. The patient has to be aware that Adderall may increase his headaches for which he is followed by Dr. Terrace ArabiaYan. Adderall 40 mg a day in the higher range of dosing. I will prescribe 20 mg tablets for twice a day use but since these are not extended release forms of the medication I would like him to try to break the 20 mg in half for the second dose. CD

## 2016-03-21 NOTE — Telephone Encounter (Signed)
I called pt to discuss. No answer, left a message asking him to call me back. 

## 2016-03-21 NOTE — Telephone Encounter (Signed)
Patient requesting Dr. Vickey Hugerohmeier write him a prescription for Adderall 20 mg x2 daily. He states Dr. Merla Richesoolittle used to prescribe it but has retired and Dr. Vickey Hugerohmeier sees him for narcolepsy. His best call back is [phone]

## 2016-03-22 MED ORDER — GADOPENTETATE DIMEGLUMINE 469.01 MG/ML IV SOLN: 15.0000 mL | Freq: Once | INTRAVENOUS | Status: DC | PRN

## 2016-03-22 NOTE — Telephone Encounter (Signed)
I called pt to discuss again. No answer, left another message asking him to call me back.

## 2016-03-22 NOTE — Telephone Encounter (Signed)
I spoke to pt. I advised him that Dr. Vickey Hugerohmeier prescribed the adderall for him, but she wants him to know that adderall may increase his headaches that Dr. Terrace ArabiaYan is treating. I advised him that Dr. Vickey Hugerohmeier prescribed the 20mg  tablets to be taken twice daily by mouth, but he should try and break the second dose in half. Pt verbalized understanding. I advised pt that the adderall RX is ready for pick up at the front desk. Pt verbalized understanding. Pt had no questions at this time but was encouraged to call back if questions arise.

## 2016-04-18 ENCOUNTER — Ambulatory Visit (INDEPENDENT_AMBULATORY_CARE_PROVIDER_SITE_OTHER): Payer: PRIVATE HEALTH INSURANCE | Admitting: Physician Assistant

## 2016-04-18 VITALS — BP 108/62 | HR 82 | Temp 97.8°F | Resp 17 | Ht 75.0 in | Wt 176.0 lb

## 2016-04-18 DIAGNOSIS — G43709 Chronic migraine without aura, not intractable, without status migrainosus: Secondary | ICD-10-CM | POA: Diagnosis not present

## 2016-04-18 DIAGNOSIS — J328 Other chronic sinusitis: Secondary | ICD-10-CM

## 2016-04-18 DIAGNOSIS — J341 Cyst and mucocele of nose and nasal sinus: Secondary | ICD-10-CM

## 2016-04-18 MED ORDER — TOPIRAMATE ER 25 MG PO SPRINKLE CAP24: 25.0000 mg | EXTENDED_RELEASE_CAPSULE | Freq: Every day | ORAL | Status: DC

## 2016-04-18 MED ORDER — FLUTICASONE PROPIONATE 50 MCG/ACT NA SUSP: 2.0000 | Freq: Every day | NASAL | Status: DC

## 2016-04-18 MED ORDER — LEVOFLOXACIN 500 MG PO TABS: 500.0000 mg | ORAL_TABLET | Freq: Every day | ORAL | Status: DC

## 2016-04-18 NOTE — Patient Instructions (Addendum)
Please call first and return in about 30 days.     IF you received an x-ray today, you will receive an invoice from [facility]. Please contact [facility] at [phone] with questions or concerns regarding your invoice.   IF you received labwork today, you will receive an invoice from [facility]. Please contact [facility] at [phone] with questions or concerns regarding your invoice.   Our billing staff will not be able to assist you with questions regarding bills from these companies.  You will be contacted with the lab results as soon as they are available. The fastest way to get your results is to activate your My Chart account. Instructions are located on the last page of this paperwork. If you have not heard from us regarding the results in 2 weeks, please contact this office.

## 2016-04-18 NOTE — Progress Notes (Signed)
04/18/2016 7:56 PM   DOB: 05/21/1981 / MRN: [id number]  SUBJECTIVE:  Curtis Moreno is a 35 y.o. male presenting for recheck of HA.  Reports he has been seeing a neurologist for the last 8 months and the therapies are not really helping with his right sided pulsatile HA.  He received an MRI of his brain two days ago and reports that no one has contacted him regarding a plan for the results, which show sinusitis, retention cyst, as well as a non specific T2/flair.    I have tried him amox-clav for sinusitis that was confirmed on CT scan previously and this did not work.  He reports that he has tried Topamax in the past and this did help somewhat with the HA.  He has not seen an ENT today.  He denies any weakness, numbness, gait difficulty today.    He has No Known Allergies.   He  has a past medical history of Allergy; Narcolepsy; Chronic sinusitis; head injury; and Headache.    He  reports that he has been smoking.  He does not have any smokeless tobacco history on file. He reports that he drinks about 1.2 oz of alcohol per week. He reports that he uses illicit drugs (Marijuana). He  reports that he currently engages in sexual activity. The patient  has past surgical history that includes Hernia repair and Patellar tendon repair.  His family history includes Bipolar disorder in his mother.  Review of Systems  Constitutional: Negative for fever.  Eyes: Positive for photophobia.  Respiratory: Negative for cough.   Cardiovascular: Negative for chest pain.  Gastrointestinal: Negative for nausea.  Genitourinary: Negative for dysuria.  Musculoskeletal: Positive for myalgias.  Skin: Negative for rash.  Neurological: Positive for headaches. Negative for dizziness.  Psychiatric/Behavioral: Negative for depression, suicidal ideas, hallucinations, memory loss and substance abuse. The patient is not nervous/anxious and does not have insomnia.     Problem list and medications reviewed and updated  by myself where necessary, and exist elsewhere in the encounter.   OBJECTIVE:  BP 108/62 mmHg  Pulse 82  Temp(Src) 97.8 F (36.6 C) (Oral)  Resp 17  Ht 6\' 3"  (1.905 m)  Wt 176 lb (79.833 kg)  BMI 22.00 kg/m2  SpO2 99%  Physical Exam  Constitutional: He is oriented to person, place, and time. He appears well-developed. He does not appear ill.  Eyes: Conjunctivae and EOM are normal. Pupils are equal, round, and reactive to light.  Cardiovascular: Normal rate.   Pulmonary/Chest: Effort normal.  Abdominal: He exhibits no distension.  Musculoskeletal: Normal range of motion.  Neurological: He is alert and oriented to person, place, and time. No cranial nerve deficit. Coordination normal.  Skin: Skin is warm and dry. He is not diaphoretic.  Psychiatric: He has a normal mood and affect.  Nursing note and vitals reviewed.   No results found for this or any previous visit (from the past 72 hour(s)).  No results found.  ASSESSMENT AND PLAN  Curtis Moreno was seen today for migraine.  Diagnoses and all orders for this visit:  Other chronic sinusitis: Will attempt to eradicate this infection.  He has failed amox-clav.  The the levo fails will get him in with an ENT if the HA persist.  He will follow up in about 30 days.  -     levofloxacin (LEVAQUIN) 500 MG tablet; Take 1 tablet (500 mg total) by mouth daily.  Chronic migraine without aura without status migrainosus, not intractable: Will  start low and titrate up.   -     Topiramate ER 25 MG CS24; Take 25 mg by mouth daily.  Retention cyst of nasal cavity: Will start this in preparation for ENT referral.  -     fluticasone (FLONASE) 50 MCG/ACT nasal spray; Place 2 sprays into both nostrils daily.    The patient was advised to call or return to clinic if he does not see an improvement in symptoms or to seek the care of the closest emergency department if he worsens with the above plan.   Deliah BostonMichael Stuart Mirabile, MHS, PA-C [facility] 04/18/2016 7:56 PM

## 2016-05-16 ENCOUNTER — Telehealth: Payer: Self-pay

## 2016-05-16 NOTE — Telephone Encounter (Signed)
Pt is needing a refill on adderall °

## 2016-05-19 NOTE — Telephone Encounter (Signed)
Patient is calling to follow up on the refill request for adderall. Please call!  [phone]

## 2016-05-21 ENCOUNTER — Other Ambulatory Visit: Payer: Self-pay | Admitting: Physician Assistant

## 2016-05-21 ENCOUNTER — Ambulatory Visit (INDEPENDENT_AMBULATORY_CARE_PROVIDER_SITE_OTHER): Payer: PRIVATE HEALTH INSURANCE | Admitting: Physician Assistant

## 2016-05-21 VITALS — BP 120/72 | HR 69 | Temp 97.5°F | Resp 15 | Ht 75.0 in | Wt 179.8 lb

## 2016-05-21 DIAGNOSIS — G8929 Other chronic pain: Secondary | ICD-10-CM

## 2016-05-21 DIAGNOSIS — R51 Headache: Secondary | ICD-10-CM

## 2016-05-21 DIAGNOSIS — G44219 Episodic tension-type headache, not intractable: Secondary | ICD-10-CM | POA: Diagnosis not present

## 2016-05-21 MED ORDER — KETOROLAC TROMETHAMINE 60 MG/2ML IM SOLN
60.0000 mg | Freq: Once | INTRAMUSCULAR | Status: AC
  Administered 2016-05-21: 60 mg via INTRAMUSCULAR

## 2016-05-21 MED ORDER — BUPROPION HCL 100 MG PO TABS: 100.0000 mg | ORAL_TABLET | Freq: Every day | ORAL | 3 refills | Status: DC

## 2016-05-21 MED ORDER — CYCLOBENZAPRINE HCL 10 MG PO TABS: 5.0000 mg | ORAL_TABLET | Freq: Every evening | ORAL | 0 refills | Status: DC | PRN

## 2016-05-21 NOTE — Progress Notes (Signed)
05/21/2016 3:46 PM   DOB: 1981-02-25 / MRN: [id number]  SUBJECTIVE:  Curtis Moreno is a 35 y.o. male presenting for chronic HA.  He has been on Levaquin and flonase for the last thirty days and reports the HA is somewhat better, however he continues to have daily symptoms.  He has a history of chronic migraines and has taken topamax in the past with some relief.  I had prescribed this to him at his last visit however he has not been taking this and says it was not at the pharmacy. He has tried Ketorolac nasal and does this daily.  He reports today's HA is somewhat different, reports that it is a dull ache and with a hat band presentation. He denies nausea.  He has been to neurology and this did not really help his symptoms.   With regard to his narcolepsy he states that he is okay once he "gets going in the morning." He has used adderall in the past with total resolution of symptoms. He is willing to try other modalities to help his symptoms.      He has No Known Allergies.   He  has a past medical history of Allergy; Chronic sinusitis; Headache; head injury; and Narcolepsy.    He  reports that he has been smoking.  He has been smoking about 0.00 packs per day. He uses smokeless tobacco. He reports that he drinks about 1.2 oz of alcohol per week . He reports that he does not use drugs. He  reports that he currently engages in sexual activity. The patient  has a past surgical history that includes Hernia repair and Patellar tendon repair.  His family history includes Bipolar disorder in his mother.  Review of Systems  Constitutional: Negative.   HENT: Negative for congestion and nosebleeds.   Eyes: Negative.   Respiratory: Negative.   Cardiovascular: Negative.   Gastrointestinal: Negative.  Negative for nausea.  Genitourinary: Negative.   Musculoskeletal: Negative.   Skin: Negative.  Negative for rash.  Neurological: Positive for headaches. Negative for dizziness, tingling, sensory  change, speech change, focal weakness and loss of consciousness.    The problem list and medications were reviewed and updated by myself where necessary and exist elsewhere in the encounter.   OBJECTIVE:  BP 120/72 (BP Location: Right Arm, Patient Position: Sitting, Cuff Size: Small)   Pulse 69   Temp 97.5 F (36.4 C) (Oral)   Resp 15   Ht 6\' 3"  (1.905 m)   Wt 179 lb 12.8 oz (81.6 kg)   SpO2 98%   BMI 22.47 kg/m   Physical Exam  Constitutional: He is oriented to person, place, and time. He appears well-developed. He does not appear ill.  Eyes: Conjunctivae and EOM are normal. Pupils are equal, round, and reactive to light.  Cardiovascular: Normal rate and regular rhythm.   Pulmonary/Chest: Effort normal and breath sounds normal.  Abdominal: He exhibits no distension.  Musculoskeletal: Normal range of motion.  Neurological: He is alert and oriented to person, place, and time. No cranial nerve deficit. Coordination normal.  Skin: Skin is warm and dry. He is not diaphoretic.  Psychiatric: He has a normal mood and affect.  Nursing note and vitals reviewed.   Lab Results  Component Value Date   TSH 1.280 03/05/2016    No results found for this or any previous visit (from the past 72 hour(s)).  No results found.   ASSESSMENT AND PLAN  Cyrie was seen today for  migraine.  Diagnoses and all orders for this visit:  Episodic tension-type headache, not intractable: This HA is different than his usual presentation and has features of a tension type. Toradol 60 mg here.  Given he is continuing to have HA after a trial of 30 days of Levaquin and chronic flonase admin will get him to an ENT for a second opinion.     -     ketorolac (TORADOL) injection 60 mg; Inject 2 mLs (60 mg total) into the muscle once. -     cyclobenzaprine (FLEXERIL) 10 MG tablet; Take 0.5-1 tablets (5-10 mg total) by mouth at bedtime as needed for muscle spasms.  Chronic nonintractable headache, unspecified  headache type: I have advised he start the Topamax today and will plan to up titrate this in about 1 month.  -     Ambulatory referral to ENT    The patient is advised to call or return to clinic if he does not see an improvement in symptoms, or to seek the care of the closest emergency department if he worsens with the above plan.   Deliah Boston, MHS, PA-C [facility] 05/21/2016 3:46 PM

## 2016-05-21 NOTE — Progress Notes (Signed)
Patient requesting Adderall .  Will try a non controlled.  He has had Wellbutrin in the past.  Advised and Rx'd Wellbutrin 100 mg qam. Deliah Boston, MS, PA-C 8:31 AM, 05/21/2016

## 2016-05-21 NOTE — Patient Instructions (Signed)
     IF you received an x-ray today, you will receive an invoice from [facility]. Please contact [facility] at [phone] with questions or concerns regarding your invoice.   IF you received labwork today, you will receive an invoice from [facility]. Please contact [facility] at [phone] with questions or concerns regarding your invoice.   Our billing staff will not be able to assist you with questions regarding bills from these companies.  You will be contacted with the lab results as soon as they are available. The fastest way to get your results is to activate your My Chart account. Instructions are located on the last page of this paperwork. If you have not heard from us regarding the results in 2 weeks, please contact this office.      

## 2016-05-21 NOTE — Telephone Encounter (Signed)
Let's try a non controlled stimulant.  He has had wellbutrin in the past.  I will send this to his pharmacy. Instructions will be on the sig. Deliah Boston, MS, PA-C 8:24 AM, 05/21/2016

## 2016-06-11 ENCOUNTER — Ambulatory Visit: Payer: Self-pay | Admitting: Neurology

## 2016-06-12 ENCOUNTER — Encounter: Payer: Self-pay | Admitting: Neurology

## 2016-06-21 ENCOUNTER — Other Ambulatory Visit: Payer: Self-pay | Admitting: Neurology

## 2016-06-21 MED ORDER — AMPHETAMINE-DEXTROAMPHETAMINE 20 MG PO TABS: ORAL_TABLET | ORAL | 0 refills | Status: DC

## 2016-06-21 NOTE — Telephone Encounter (Signed)
Patient requesting refill of  adderall  Pharmacy: pick up

## 2016-06-21 NOTE — Telephone Encounter (Signed)
I left a message on pt's cell phone, per DPR, and advised him that his RX is ready for pick up at the front desk.

## 2016-08-02 ENCOUNTER — Other Ambulatory Visit: Payer: Self-pay | Admitting: Neurology

## 2016-08-02 NOTE — Telephone Encounter (Signed)
Pt request refill for amphetamine-dextroamphetamine (ADDERALL) 20 MG tablet °

## 2016-08-03 MED ORDER — AMPHETAMINE-DEXTROAMPHETAMINE 20 MG PO TABS: ORAL_TABLET | ORAL | 0 refills | Status: DC

## 2016-08-06 NOTE — Telephone Encounter (Signed)
RX for adderall is available for pick up at the front desk.

## 2016-08-06 NOTE — Telephone Encounter (Signed)
I called pt to advise him that his adderall RX is ready for pick up at the front desk. No answer, left a message asking him to call me back. If pt calls back, please advise him of this information. 

## 2016-09-06 ENCOUNTER — Telehealth: Payer: Self-pay | Admitting: Neurology

## 2016-09-06 NOTE — Telephone Encounter (Signed)
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  He has been placed on the schedule for his follow up, with Megan, on 09/10/16.  Says he can wait for his prescription until that time.

## 2016-09-06 NOTE — Telephone Encounter (Signed)
Patient requesting refill of amphetamine-dextroamphetamine (ADDERALL) 20 MG tablet °Pharmacy: pick up ° °

## 2016-09-06 NOTE — Telephone Encounter (Signed)
He is past due for an appt.  His Adderall is prescribed by Dr. Vickey Hugerohmeier for narcolepsy.  His last prescription on 08/03/16 stated no further refills until seen.  I have attempted to reach him by phone multiple times.  Left message for a return call.  He will need to schedule his yearly follow up with Dr. Vickey Hugerohmeier or NP.  Once his appt is scheduled, we can check with Dr. Vickey Hugerohmeier to see if she will refill until his appt date.

## 2016-09-10 ENCOUNTER — Ambulatory Visit (INDEPENDENT_AMBULATORY_CARE_PROVIDER_SITE_OTHER): Payer: PRIVATE HEALTH INSURANCE | Admitting: Adult Health

## 2016-09-10 ENCOUNTER — Encounter: Payer: Self-pay | Admitting: Adult Health

## 2016-09-10 VITALS — BP 123/76 | HR 64 | Wt 194.2 lb

## 2016-09-10 DIAGNOSIS — G47419 Narcolepsy without cataplexy: Secondary | ICD-10-CM | POA: Diagnosis not present

## 2016-09-10 DIAGNOSIS — R51 Headache: Secondary | ICD-10-CM

## 2016-09-10 DIAGNOSIS — R519 Headache, unspecified: Secondary | ICD-10-CM

## 2016-09-10 MED ORDER — AMPHETAMINE-DEXTROAMPHETAMINE 20 MG PO TABS: ORAL_TABLET | ORAL | 0 refills | Status: DC

## 2016-09-10 NOTE — Patient Instructions (Signed)
Continue Adderall If your symptoms worsen or you develop new symptoms please let us know.   

## 2016-09-10 NOTE — Progress Notes (Signed)
I agree with the assessment and plan as directed by NP .The patient is known to me .   Sherwin Hollingshed, MD  
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
## 2016-10-10 ENCOUNTER — Telehealth: Payer: Self-pay | Admitting: *Deleted

## 2016-10-10 ENCOUNTER — Other Ambulatory Visit: Payer: Self-pay | Admitting: Adult Health

## 2016-10-10 MED ORDER — AMPHETAMINE-DEXTROAMPHETAMINE 20 MG PO TABS: ORAL_TABLET | ORAL | 0 refills | Status: DC

## 2016-10-10 NOTE — Telephone Encounter (Signed)
Pt request refill for amphetamine-dextroamphetamine (ADDERALL) 20 MG tablet . Pt is aware the clinic closes at noon on Friday and is closed Monday and Tuesday of next week

## 2016-10-10 NOTE — Telephone Encounter (Signed)
Rx for Adderall printed, signed and placed up front.

## 2017-03-13 ENCOUNTER — Telehealth: Payer: Self-pay

## 2017-03-13 ENCOUNTER — Ambulatory Visit: Payer: PRIVATE HEALTH INSURANCE | Admitting: Neurology

## 2017-03-13 NOTE — Telephone Encounter (Signed)
Pt did not show for their appt with Dr. Dohmeier today.  

## 2017-03-14 ENCOUNTER — Encounter: Payer: Self-pay | Admitting: Neurology

## 2017-03-15 ENCOUNTER — Observation Stay (HOSPITAL_COMMUNITY)
Admission: AD | Admit: 2017-03-15 | Discharge: 2017-03-16 | Disposition: A | Discharge status: Home / Self Care | Payer: 59 | Source: Intra-hospital | Attending: Psychiatry | Admitting: Psychiatry

## 2017-03-15 ENCOUNTER — Emergency Department (HOSPITAL_COMMUNITY)
Admission: EM | Admit: 2017-03-15 | Discharge: 2017-03-15 | Disposition: A | Discharge status: Other Acute Inpatient Hospital | Payer: 59 | Attending: Emergency Medicine | Admitting: Emergency Medicine

## 2017-03-15 ENCOUNTER — Encounter (HOSPITAL_COMMUNITY): Payer: Self-pay | Admitting: *Deleted

## 2017-03-15 ENCOUNTER — Encounter (HOSPITAL_COMMUNITY): Payer: Self-pay | Admitting: Emergency Medicine

## 2017-03-15 DIAGNOSIS — F1020 Alcohol dependence, uncomplicated: Secondary | ICD-10-CM | POA: Diagnosis present

## 2017-03-15 DIAGNOSIS — F329 Major depressive disorder, single episode, unspecified: Secondary | ICD-10-CM

## 2017-03-15 DIAGNOSIS — Z818 Family history of other mental and behavioral disorders: Secondary | ICD-10-CM | POA: Insufficient documentation

## 2017-03-15 DIAGNOSIS — Z79899 Other long term (current) drug therapy: Secondary | ICD-10-CM | POA: Insufficient documentation

## 2017-03-15 DIAGNOSIS — F1010 Alcohol abuse, uncomplicated: Secondary | ICD-10-CM | POA: Insufficient documentation

## 2017-03-15 DIAGNOSIS — F17210 Nicotine dependence, cigarettes, uncomplicated: Secondary | ICD-10-CM | POA: Insufficient documentation

## 2017-03-15 DIAGNOSIS — F332 Major depressive disorder, recurrent severe without psychotic features: Principal | ICD-10-CM | POA: Insufficient documentation

## 2017-03-15 DIAGNOSIS — G47419 Narcolepsy without cataplexy: Secondary | ICD-10-CM | POA: Insufficient documentation

## 2017-03-15 DIAGNOSIS — F411 Generalized anxiety disorder: Secondary | ICD-10-CM | POA: Insufficient documentation

## 2017-03-15 DIAGNOSIS — F17200 Nicotine dependence, unspecified, uncomplicated: Secondary | ICD-10-CM | POA: Insufficient documentation

## 2017-03-15 DIAGNOSIS — F418 Other specified anxiety disorders: Secondary | ICD-10-CM | POA: Insufficient documentation

## 2017-03-15 DIAGNOSIS — F32A Depression, unspecified: Secondary | ICD-10-CM

## 2017-03-15 DIAGNOSIS — F10129 Alcohol abuse with intoxication, unspecified: Secondary | ICD-10-CM | POA: Insufficient documentation

## 2017-03-15 LAB — RAPID URINE DRUG SCREEN, HOSP PERFORMED
Amphetamines: NOT DETECTED
BARBITURATES: NOT DETECTED
Benzodiazepines: NOT DETECTED
COCAINE: NOT DETECTED
Opiates: NOT DETECTED
TETRAHYDROCANNABINOL: NOT DETECTED

## 2017-03-15 LAB — COMPREHENSIVE METABOLIC PANEL
ALT: 14 U/L — ABNORMAL LOW (ref 17–63)
AST: 35 U/L (ref 15–41)
Albumin: 4.9 g/dL (ref 3.5–5.0)
Alkaline Phosphatase: 62 U/L (ref 38–126)
Anion gap: 8 (ref 5–15)
BUN: 8 mg/dL (ref 6–20)
CHLORIDE: 111 mmol/L (ref 101–111)
CO2: 25 mmol/L (ref 22–32)
CREATININE: 0.76 mg/dL (ref 0.61–1.24)
Calcium: 9.4 mg/dL (ref 8.9–10.3)
GFR calc Af Amer: >60 mL/min (ref >60)
Glucose, Bld: 108 mg/dL — ABNORMAL HIGH (ref 65–99)
POTASSIUM: 4 mmol/L (ref 3.5–5.1)
SODIUM: 144 mmol/L (ref 135–145)
Total Bilirubin: 0.4 mg/dL (ref 0.3–1.2)
Total Protein: 8.1 g/dL (ref 6.5–8.1)

## 2017-03-15 LAB — CBC
HEMATOCRIT: 42.3 % (ref 39.0–52.0)
HEMOGLOBIN: 14.7 g/dL (ref 13.0–17.0)
MCH: 32.1 pg (ref 26.0–34.0)
MCHC: 34.8 g/dL (ref 30.0–36.0)
MCV: 92.4 fL (ref 78.0–100.0)
Platelets: 247 10*3/uL (ref 150–400)
RBC: 4.58 MIL/uL (ref 4.22–5.81)
RDW: 12.7 % (ref 11.5–15.5)
WBC: 11.1 10*3/uL — ABNORMAL HIGH (ref 4.0–10.5)

## 2017-03-15 LAB — ETHANOL: Alcohol, Ethyl (B): 223 mg/dL — ABNORMAL HIGH (ref <5)

## 2017-03-15 MED ORDER — TRAZODONE HCL 50 MG PO TABS
50.0000 mg | ORAL_TABLET | Freq: Every evening | ORAL | Status: DC | PRN
  Administered 2017-03-15: 50 mg via ORAL
  Filled 2017-03-15: qty 1

## 2017-03-15 MED ORDER — IBUPROFEN 200 MG PO TABS
600.0000 mg | ORAL_TABLET | Freq: Three times a day (TID) | ORAL | Status: DC | PRN
  Administered 2017-03-15: 600 mg via ORAL
  Filled 2017-03-15: qty 3

## 2017-03-15 MED ORDER — LORAZEPAM 1 MG PO TABS: 1.0000 mg | ORAL_TABLET | Freq: Every day | ORAL | Status: DC

## 2017-03-15 MED ORDER — HYDROXYZINE HCL 25 MG PO TABS: 25.0000 mg | ORAL_TABLET | Freq: Four times a day (QID) | ORAL | Status: DC | PRN

## 2017-03-15 MED ORDER — ACETAMINOPHEN 325 MG PO TABS
650.0000 mg | ORAL_TABLET | Freq: Four times a day (QID) | ORAL | Status: DC | PRN
  Administered 2017-03-16: 650 mg via ORAL
  Filled 2017-03-15: qty 2

## 2017-03-15 MED ORDER — ONDANSETRON 4 MG PO TBDP: 4.0000 mg | ORAL_TABLET | Freq: Four times a day (QID) | ORAL | Status: DC | PRN

## 2017-03-15 MED ORDER — VITAMIN B-1 100 MG PO TABS
100.0000 mg | ORAL_TABLET | Freq: Every day | ORAL | Status: DC
  Administered 2017-03-15: 100 mg via ORAL
  Filled 2017-03-15: qty 1

## 2017-03-15 MED ORDER — LORAZEPAM 1 MG PO TABS: 1.0000 mg | ORAL_TABLET | Freq: Two times a day (BID) | ORAL | Status: DC

## 2017-03-15 MED ORDER — THIAMINE HCL 100 MG/ML IJ SOLN
100.0000 mg | Freq: Once | INTRAMUSCULAR | Status: AC
  Administered 2017-03-15: 100 mg via INTRAMUSCULAR
  Filled 2017-03-15: qty 2

## 2017-03-15 MED ORDER — LOPERAMIDE HCL 2 MG PO CAPS: 2.0000 mg | ORAL_CAPSULE | ORAL | Status: DC | PRN

## 2017-03-15 MED ORDER — LORAZEPAM 1 MG PO TABS
1.0000 mg | ORAL_TABLET | Freq: Four times a day (QID) | ORAL | Status: DC
  Administered 2017-03-15 – 2017-03-16 (×2): 1 mg via ORAL
  Filled 2017-03-15 (×2): qty 1

## 2017-03-15 MED ORDER — MAGNESIUM HYDROXIDE 400 MG/5ML PO SUSP: 30.0000 mL | Freq: Every day | ORAL | Status: DC | PRN

## 2017-03-15 MED ORDER — LORAZEPAM 1 MG PO TABS
0.0000 mg | ORAL_TABLET | Freq: Four times a day (QID) | ORAL | Status: DC
Start: 2017-03-15 — End: 2017-03-15
  Administered 2017-03-15: 1 mg via ORAL
  Filled 2017-03-15: qty 1

## 2017-03-15 MED ORDER — LORAZEPAM 1 MG PO TABS: 0.0000 mg | ORAL_TABLET | Freq: Two times a day (BID) | ORAL | Status: DC

## 2017-03-15 MED ORDER — THIAMINE HCL 100 MG/ML IJ SOLN: 100.0000 mg | Freq: Every day | INTRAMUSCULAR | Status: DC

## 2017-03-15 MED ORDER — LORAZEPAM 1 MG PO TABS: 1.0000 mg | ORAL_TABLET | Freq: Four times a day (QID) | ORAL | Status: DC | PRN

## 2017-03-15 MED ORDER — VITAMIN B-1 100 MG PO TABS
100.0000 mg | ORAL_TABLET | Freq: Every day | ORAL | Status: DC
  Administered 2017-03-16: 100 mg via ORAL
  Filled 2017-03-15: qty 1

## 2017-03-15 MED ORDER — ALUM & MAG HYDROXIDE-SIMETH 200-200-20 MG/5ML PO SUSP: 30.0000 mL | ORAL | Status: DC | PRN

## 2017-03-15 MED ORDER — LORAZEPAM 2 MG/ML IJ SOLN: 0.0000 mg | Freq: Four times a day (QID) | INTRAMUSCULAR | Status: DC

## 2017-03-15 MED ORDER — LORAZEPAM 1 MG PO TABS: 1.0000 mg | ORAL_TABLET | Freq: Three times a day (TID) | ORAL | Status: DC

## 2017-03-15 MED ORDER — LORAZEPAM 2 MG/ML IJ SOLN: 0.0000 mg | Freq: Two times a day (BID) | INTRAMUSCULAR | Status: DC

## 2017-03-15 MED ORDER — ADULT MULTIVITAMIN W/MINERALS CH
1.0000 | ORAL_TABLET | Freq: Every day | ORAL | Status: DC
  Administered 2017-03-16: 1 via ORAL
  Filled 2017-03-15: qty 1

## 2017-03-15 NOTE — ED Notes (Signed)
Patient denies SI,HI and AVH at this time. Plan of care discussed with patient. Patient voices no complaints or concerns. Encouragement and support provided and safety maintain. Q 15 min safety checks in place and video monitoring.
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
## 2017-03-15 NOTE — Progress Notes (Signed)
03/15/17 1332:  Pt was sleep.  Lowry Bala, LRT/CTRS  

## 2017-03-15 NOTE — BH Assessment (Signed)
[facility] Assessment Progress Note  Per Thedore MinsMojeed Akintayo, MD, this pt would benefit [facility] at [facility].  Malva LimesLinsey Strader, RN, [facility] has pre-assigned pt to Obs 6; they will be ready to receive pt at 19:30.  Pt has signed Voluntary Admission and Consent for Treatment, as well as Consent to Release Information to two friends, and signed [facility].  Pt's nurse, Morrie Sheldonshley, has been notified, and agrees to send original paperwork along with pt via Juel [facility]rowel[facility], and to call report to [phone] or [phone].  Doylene Canninghomas Takari Duncombe, MA Triage Specialist [phone]
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
## 2017-03-15 NOTE — ED Notes (Signed)
Patient educated about search process and term "contraband " and routine search performed. No contraband found. 

## 2017-03-15 NOTE — ED Provider Notes (Signed)
6:45 PM patient is alert and motor Glasgow Coma Score 15, pleasant and cooperative. Stable for [facility] to [facility] Dr. Lucianne MussKumar accepting physician Results for orders placed or performed during the hospital encounter of 03/15/17  Comprehensive metabolic panel  Result Value Ref Range   Sodium 144 135 - 145 mmol/L   Potassium 4.0 3.5 - 5.1 mmol/L   Chloride 111 101 - 111 mmol/L   CO2 25 22 - 32 mmol/L   Glucose, Bld 108 (H) 65 - 99 mg/dL   BUN 8 6 - 20 mg/dL   Creatinine, Ser 7.820.76 0.61 - 1.24 mg/dL   Calcium 9.4 8.9 - 95.610.3 mg/dL   Total Protein 8.1 6.5 - 8.1 g/dL   Albumin 4.9 3.5 - 5.0 g/dL   AST 35 15 - 41 U/L   ALT 14 (L) 17 - 63 U/L   Alkaline Phosphatase 62 38 - 126 U/L   Total Bilirubin 0.4 0.3 - 1.2 mg/dL   GFR calc non Af Amer >60 >60 mL/min   GFR calc Af Amer >60 >60 mL/min   Anion gap 8 5 - 15  Ethanol  Result Value Ref Range   Alcohol, Ethyl (B) 223 (H) <5 mg/dL  cbc  Result Value Ref Range   WBC 11.1 (H) 4.0 - 10.5 K/uL   RBC 4.58 4.22 - 5.81 MIL/uL   Hemoglobin 14.7 13.0 - 17.0 g/dL   HCT 21.342.3 08.639.0 - 57.852.0 %   MCV 92.4 78.0 - 100.0 fL   MCH 32.1 26.0 - 34.0 pg   MCHC 34.8 30.0 - 36.0 g/dL   RDW 46.912.7 62.911.5 - 52.815.5 %   Platelets 247 150 - 400 K/uL  Rapid urine drug screen (hospital performed)  Result Value Ref Range   Opiates NONE DETECTED NONE DETECTED   Cocaine NONE DETECTED NONE DETECTED   Benzodiazepines NONE DETECTED NONE DETECTED   Amphetamines NONE DETECTED NONE DETECTED   Tetrahydrocannabinol NONE DETECTED NONE DETECTED   Barbiturates NONE DETECTED NONE DETECTED   No results found.   Doug SouJacubowitz, Lylah Lantis, MD 03/15/17 [phone]

## 2017-03-15 NOTE — ED Notes (Addendum)
Pt behavior cooperative. Denies SI/HI/AVH. Encourgement and support provided. CIWA protocol in place. Special checks q 15 mins in place for safety, Video monitoring in place. Will continue to monitor.
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
## 2017-03-15 NOTE — ED Triage Notes (Signed)
Upon questioning Mr Katrinka BlazingSmith denies suicidal or homocidal ideation but admits to depression r/t life events. Pt reports wife is cheating on him and he has legal issues r/t DUI. He further states concerns about his 2 small child and being able to support them. Mr Katrinka BlazingSmith smells strongly of ETOH but is alert x3 and verbally appropriate in his responses to questions but is occasionally tearful when discussing the circumstance surrounding his visit to the ED. Affect flat mood labile but behavior is self controlled and appropriate. Pt continues to deny suicidal / homocidal ideation or AV hallucinations
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
## 2017-03-15 NOTE — ED Notes (Signed)
Orders received to discharge patient to OBS unit. Pt aware and discharge summery reviewed with patient as well as review of medications. All personal items return and patient signed all discharge paperwork. Pt escorted off unit.
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
## 2017-03-16 DIAGNOSIS — Z818 Family history of other mental and behavioral disorders: Secondary | ICD-10-CM | POA: Diagnosis not present

## 2017-03-16 DIAGNOSIS — F332 Major depressive disorder, recurrent severe without psychotic features: Principal | ICD-10-CM

## 2017-03-16 MED ORDER — HYDROXYZINE HCL 25 MG PO TABS: 25.0000 mg | ORAL_TABLET | Freq: Four times a day (QID) | ORAL | 0 refills | Status: AC | PRN

## 2017-03-16 MED ORDER — CITALOPRAM HYDROBROMIDE 10 MG PO TABS
10.0000 mg | ORAL_TABLET | Freq: Every day | ORAL | Status: DC
  Administered 2017-03-16: 10 mg via ORAL
  Filled 2017-03-16: qty 1

## 2017-03-16 MED ORDER — TRAZODONE HCL 50 MG PO TABS: 50.0000 mg | ORAL_TABLET | Freq: Every evening | ORAL | 0 refills | Status: AC | PRN

## 2017-03-16 MED ORDER — CITALOPRAM HYDROBROMIDE 10 MG PO TABS: 10.0000 mg | ORAL_TABLET | Freq: Every day | ORAL | 0 refills | Status: AC

## 2017-03-16 NOTE — Discharge Summary (Signed)
[facility] Observation Discharge Summary Note  Patient:  Curtis Moreno is an 36 y.o., male MRN:  [id number] DOB:  1980/11/06 Patient phone:  [phone] (home)  Patient address:   [address] [place] [address],  Total Time spent with patient: 20 minutes  Date of Admission:  03/15/2017 Date of Discharge: 03/16/2017  Reason for Admission:  Alcohol Intoxication  Principal Problem: MDD (major depressive disorder), recurrent severe, without psychosis [facility])  Discharge Diagnoses: Patient Active Problem List   Diagnosis Date Noted  . MDD (major depressive disorder), recurrent severe, without psychosis (HCC) [F33.2] 03/15/2017    Priority: High  . Alcohol use disorder, severe, dependence (HCC) [F10.20] 07/20/2015    Priority: High  . Chronic headache [R51] 03/05/2016  . Diplopia [H53.2] 03/05/2016  . Ptosis, right eyelid [H02.401] 03/05/2016  . Tobacco use disorder [F17.200] 05/13/2015  . 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 Allergy   . Chronic sinusitis   . Headache   . Hx of head injury   . Narcolepsy     Past Surgical History:  Procedure Laterality Date  . HERNIA REPAIR    . PATELLAR TENDON REPAIR     Family History:  Family History  Problem Relation Age of Onset  . Bipolar disorder Mother    Family Psychiatric  History: Unknown Social History:  History  Alcohol Use  . 1.2 oz/week  . 2 Cans of beer per week    Comment: Once every few weeks      History  Drug Use No    Comment: Occasionallly    Social History   Social History  . Marital status: Married    Spouse name: N/A  . Number of children: 2  . Years of education: 13   Occupational History  . 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 Smoking status: Current Every Day Smoker    Packs/day: 0.50  . Smokeless tobacco: Never Used     Comment: 09/10/16 trying to quit  . Alcohol  use 1.2 oz/week    2 Cans of beer per week     Comment: Once every few weeks   . Drug use: No     Comment: Occasionallly  . Sexual activity: Yes   Other Topics Concern  . None   Social History Narrative   Lives at home with wife and two children.   Right-handed.   8+ cups caffeine per day.    Hospital Course:  Curtis Moreno is a 36 year old male who presented to [facility] OBS unit seeking help for alcohol abuse and detox.  Pt denies suicidal/homicidal ideation, denies auditory/visual hallucinations and does not appear to be responding to internal stimuli. Pt was calm and cooperative, alert & oriented x 4, and appropriate for the situation. Pt interacted appropriately with staff. Pt stated he is mainly a binge drinker and when he binges it is too much. Pt stated he has three DUI's and an assault on male charges pending. Pt stated his lawyer wants him to seek treatment for his alcohol abuse before going to court. Pt also stated his life is a big mess. Pt states "I am married and have two children ages 56 and 20. 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 I take care of the kids during the day and pay all the bills. I sleep on  the couch because my wife has a boyfriend who comes over and sleeps in the bed with my wife. I don't get much sleep and I am lonely. I worry about my kids." Pt is requesting to be discharged so he can go to work today. Pt is willing to follow up with outpatient resources on Tuesday, due to holiday weekend. Pt also has a Child psychotherapist that is helping him find a treatment facility that he can afford, Pt has no insurance. Pt's BAL 223 on admission to the ED.    Physical Findings: AIMS:  , ,  ,  ,    CIWA:    COWS:     Musculoskeletal: Strength & Muscle Tone: within normal limits Gait & Station: normal Patient leans: N/A  Psychiatric Specialty Exam: Physical Exam  Constitutional: He is oriented to person, place, and time. He appears well-developed and  well-nourished.  HENT:  Head: Normocephalic.  Right Ear: External ear normal.  Left Ear: External ear normal.  Neck: Normal range of motion.  Musculoskeletal: Normal range of motion.  Neurological: He is alert and oriented to person, place, and time.    Review of Systems  Psychiatric/Behavioral: Positive for depression and substance abuse. Negative for hallucinations, memory loss and suicidal ideas. The patient is nervous/anxious. The patient does not have insomnia.   All other systems reviewed and are negative.   Blood pressure 117/71, pulse 75, temperature 97.8 F (36.6 C), temperature source Oral, resp. rate 20, height 6\' 3"  (1.905 m), weight 86.2 kg (190 lb), SpO2 97 %.Body mass index is 23.75 kg/m.  General Appearance: Casual  Eye Contact:  Good  Speech:  Clear and Coherent and Normal Rate  Volume:  Normal  Mood:  Anxious and Depressed  Affect:  Congruent and Depressed  Thought Process:  Coherent, Goal Directed and Linear  Orientation:  Full (Time, Place, and Person)  Thought Content:  Logical  Suicidal Thoughts:  No  Homicidal Thoughts:  No  Memory:  Immediate;   Good Recent;   Good Remote;   Fair  Judgement:  Good  Insight:  Fair  Psychomotor Activity:  Normal  Concentration:  Concentration: Good and Attention Span: Good  Recall:  Good  Fund of Knowledge:  Good  Language:  Good  Akathisia:  No  Handed:  Right  AIMS (if indicated):     Assets:  Communication Skills Desire for Improvement Financial Resources/Insurance Housing Leisure Time Physical Health Social Support Vocational/Educational  ADL's:  Intact  Cognition:  WNL  Sleep:           Has this patient used any form of tobacco in the last 30 days? (Cigarettes, Smokeless Tobacco, Cigars, and/or Pipes) Yes, No  Blood Alcohol level:  Lab Results  Component Value Date   ETH 223 (H) 03/15/2017   ETH 197 (H) 07/19/2015    Metabolic Disorder Labs:  No results found for: HGBA1C, MPG No results  found for: PROLACTIN Lab Results  Component Value Date   CHOL 132 06/15/2013   TRIG 66 06/15/2013   HDL 46 06/15/2013   CHOLHDL 2.9 06/15/2013   VLDL 13 06/15/2013   LDLCALC 73 06/15/2013    See Psychiatric Specialty Exam and Suicide Risk Assessment completed by Attending Physician prior to discharge.  Discharge destination:  Home  Is patient on multiple antipsychotic therapies at discharge:  No   Has Patient had three or more failed trials of antipsychotic monotherapy by history:  No  Recommended Plan for Multiple Antipsychotic Therapies: NA  Allergies as of 03/16/2017   No Known Allergies     Medication List    TAKE these medications     Indication  citalopram 10 MG tablet Commonly known as:  CELEXA Take 1 tablet (10 mg total) by mouth daily.  Indication:  Depression   hydrOXYzine 25 MG tablet Commonly known as:  ATARAX/VISTARIL Take 1 tablet (25 mg total) by mouth every 6 (six) hours as needed for anxiety.  Indication:  Anxiety Neurosis   traZODone 50 MG tablet Commonly known as:  DESYREL Take 1 tablet (50 mg total) by mouth at bedtime as needed for sleep.  Indication:  Trouble Sleeping      Follow-up Information    Group, [facility]. Call on 03/19/2017.   Specialty:  Behavioral Health Why:  Follow Up Contact information: [address] [place] [address] [phone]           Follow-up recommendations:  Activity:  as tolerated Diet:  Regular Other:  Follow up with outpatient resources provided for detox and therapy (See above) Follow up with PCP for any new or existing medical issues Avoid the use of drugs and alcohol Take all medications as prescribed Celexa 10mg  PO QD Vistaril 25mg  PO Q6Hr PRN Trazadone 50mg  QHS PRN  Comments:  Discharge Home  Signed: Laveda AbbeLaurie Britton Lucette Kratz, NP 03/16/2017, 11:34 AM

## 2017-03-16 NOTE — Progress Notes (Signed)
Patient discharged to lobby. Patient was stable and appreciative at that time. All papers and prescriptions were given and valuables returned. Verbal understanding expressed. Denies SI/HI and A/VH. Patient given opportunity to express concerns and ask questions.  

## 2017-03-16 NOTE — BHH Counselor (Signed)
Pt was provided with resources for OPT services and he was advised to follow up with [facility] for medication management and therapy. Pt was receptive of this information. Pt will be getting discharged on today and will be going back to his home environment.

## 2017-06-02 ENCOUNTER — Emergency Department (HOSPITAL_COMMUNITY): Payer: Worker's Compensation

## 2017-06-02 ENCOUNTER — Emergency Department (HOSPITAL_COMMUNITY)
Admission: EM | Admit: 2017-06-02 | Discharge: 2017-06-02 | Disposition: A | Discharge status: Home / Self Care | Payer: Worker's Compensation | Attending: Emergency Medicine | Admitting: Emergency Medicine

## 2017-06-02 ENCOUNTER — Encounter (HOSPITAL_COMMUNITY): Payer: Self-pay | Admitting: Nurse Practitioner

## 2017-06-02 DIAGNOSIS — Y990 Civilian activity done for income or pay: Secondary | ICD-10-CM | POA: Insufficient documentation

## 2017-06-02 DIAGNOSIS — Z79899 Other long term (current) drug therapy: Secondary | ICD-10-CM | POA: Insufficient documentation

## 2017-06-02 DIAGNOSIS — S80812A Abrasion, left lower leg, initial encounter: Secondary | ICD-10-CM | POA: Insufficient documentation

## 2017-06-02 DIAGNOSIS — S8012XA Contusion of left lower leg, initial encounter: Secondary | ICD-10-CM | POA: Insufficient documentation

## 2017-06-02 DIAGNOSIS — F17210 Nicotine dependence, cigarettes, uncomplicated: Secondary | ICD-10-CM | POA: Insufficient documentation

## 2017-06-02 DIAGNOSIS — T148XXA Other injury of unspecified body region, initial encounter: Secondary | ICD-10-CM

## 2017-06-02 DIAGNOSIS — L089 Local infection of the skin and subcutaneous tissue, unspecified: Secondary | ICD-10-CM

## 2017-06-02 DIAGNOSIS — Y9289 Other specified places as the place of occurrence of the external cause: Secondary | ICD-10-CM | POA: Insufficient documentation

## 2017-06-02 DIAGNOSIS — Y939 Activity, unspecified: Secondary | ICD-10-CM | POA: Insufficient documentation

## 2017-06-02 DIAGNOSIS — W228XXA Striking against or struck by other objects, initial encounter: Secondary | ICD-10-CM | POA: Insufficient documentation

## 2017-06-02 DIAGNOSIS — Z23 Encounter for immunization: Secondary | ICD-10-CM | POA: Insufficient documentation

## 2017-06-02 MED ORDER — CLINDAMYCIN HCL 300 MG PO CAPS
300.0000 mg | ORAL_CAPSULE | Freq: Once | ORAL | Status: AC
  Administered 2017-06-02: 300 mg via ORAL
  Filled 2017-06-02: qty 1

## 2017-06-02 MED ORDER — TRAMADOL HCL 50 MG PO TABS
50.0000 mg | ORAL_TABLET | Freq: Once | ORAL | Status: AC
  Administered 2017-06-02: 50 mg via ORAL
  Filled 2017-06-02: qty 1

## 2017-06-02 MED ORDER — DICLOFENAC SODIUM 50 MG PO TBEC: 50.0000 mg | DELAYED_RELEASE_TABLET | Freq: Two times a day (BID) | ORAL | 0 refills | Status: AC

## 2017-06-02 MED ORDER — TETANUS-DIPHTH-ACELL PERTUSSIS 5-2.5-18.5 LF-MCG/0.5 IM SUSP
0.5000 mL | Freq: Once | INTRAMUSCULAR | Status: AC
  Administered 2017-06-02: 0.5 mL via INTRAMUSCULAR
  Filled 2017-06-02: qty 0.5

## 2017-06-02 MED ORDER — CLINDAMYCIN HCL 300 MG PO CAPS: 300.0000 mg | ORAL_CAPSULE | Freq: Three times a day (TID) | ORAL | 0 refills | Status: DC

## 2017-06-02 MED ORDER — BACITRACIN ZINC 500 UNIT/GM EX OINT
TOPICAL_OINTMENT | Freq: Once | CUTANEOUS | Status: AC
  Administered 2017-06-02: 1 via TOPICAL
  Filled 2017-06-02: qty 3.6

## 2017-06-02 MED ORDER — TRAMADOL HCL 50 MG PO TABS: 50.0000 mg | ORAL_TABLET | Freq: Four times a day (QID) | ORAL | 0 refills | Status: AC | PRN

## 2017-06-02 NOTE — ED Provider Notes (Signed)
Medical screening examination/treatment/procedure(s) were conducted as a shared visit with non-physician practitioner(s) and myself.  I personally evaluated the patient during the encounter.   EKG Interpretation None     36 year old male here after sustaining an injury 2 days ago to his left lower extremity. Does have some beginning evidence of infection. Compartment is soft. Will prescribe antibiotics   Lorre NickAllen, Eran Mistry, MD 06/02/17 2243

## 2017-06-02 NOTE — ED Provider Notes (Signed)
[facility]-EMERGENCY DEPT Provider Note   CSN: [id number] Arrival date & time: 06/02/17  1923     History   Chief Complaint Chief Complaint  Patient presents with  . Leg Injury    HPI Curtis Moreno is a 36 y.o. male who presents to the ED with left leg pain. Patient reports that an ice machine fell on his leg 2 days ago while at work. He states that it caused an abrasion that went from his knee to his ankle. He denies calf pain. He does reports swelling to the ankle and small amount of drainage from the wound. He has cleaned the area, applied neosporin ointment and covered it with a dressing. Patient is unsure of last tetanus.  HPI  Past Medical History:  Diagnosis Date  . Allergy   . Chronic sinusitis   . Headache   . Hx of head injury   . Narcolepsy     Patient Active Problem List   Diagnosis Date Noted  . MDD (major depressive disorder), recurrent severe, without psychosis (HCC) 03/15/2017  . Chronic headache 03/05/2016  . Diplopia 03/05/2016  . Ptosis, right eyelid 03/05/2016  . Alcohol use disorder, severe, dependence (HCC) 07/20/2015  . Tobacco use disorder 05/13/2015  . Narcolepsy 06/15/2013    Past Surgical History:  Procedure Laterality Date  . HERNIA REPAIR    . PATELLAR TENDON REPAIR         Home Medications    Prior to Admission medications   Medication Sig Start Date End Date Taking? Authorizing Provider  citalopram (CELEXA) 10 MG tablet Take 1 tablet (10 mg total) by mouth daily. 03/16/17   Laveda Abbe, NP  clindamycin (CLEOCIN) 300 MG capsule Take 1 capsule (300 mg total) by mouth 3 (three) times daily. 06/02/17   Janne Napoleon, NP  diclofenac (VOLTAREN) 50 MG EC tablet Take 1 tablet (50 mg total) by mouth 2 (two) times daily. 06/02/17   Janne Napoleon, NP  hydrOXYzine (ATARAX/VISTARIL) 25 MG tablet Take 1 tablet (25 mg total) by mouth every 6 (six) hours as needed for anxiety. 03/16/17   Laveda Abbe, NP  traMADol (ULTRAM) 50 MG  tablet Take 1 tablet (50 mg total) by mouth every 6 (six) hours as needed. 06/02/17   Janne Napoleon, NP  traZODone (DESYREL) 50 MG tablet Take 1 tablet (50 mg total) by mouth at bedtime as needed for sleep. 03/16/17   Laveda Abbe, NP    Family History Family History  Problem Relation Age of Onset  . Bipolar disorder Mother     Social History Social History  Substance Use Topics  . Smoking status: Current Every Day Smoker    Packs/day: 0.50  . Smokeless tobacco: Never Used     Comment: 09/10/16 trying to quit  . Alcohol use 1.2 oz/week    2 Cans of beer per week     Comment: Once every few weeks      Allergies   Patient has no known allergies.   Review of Systems Review of Systems  Constitutional: Negative for chills and fever.  HENT: Negative.   Cardiovascular: Positive for leg swelling (left lower at site of injury).  Gastrointestinal: Negative for nausea and vomiting.  Musculoskeletal: Positive for arthralgias.  Skin: Positive for color change and wound.  Neurological: Negative for syncope.  Psychiatric/Behavioral: The patient is not nervous/anxious.      Physical Exam Updated Vital Signs BP 120/84   Pulse 70   Temp 98.4  F (36.9 C) (Oral)   Resp 16   SpO2 99%   Physical Exam  Constitutional: He is oriented to person, place, and time. He appears well-developed and well-nourished. No distress.  HENT:  Head: Normocephalic and atraumatic.  Eyes: EOM are normal.  Neck: Neck supple.  Cardiovascular: Normal rate.   Pulmonary/Chest: Effort normal.  Abdominal: There is tenderness.  Musculoskeletal:       Left ankle: He exhibits swelling and ecchymosis. He exhibits normal range of motion, no deformity and normal pulse. Tenderness.       Left lower leg: He exhibits tenderness and swelling. He exhibits no deformity. Lacerations: abrasion.       Legs: No calf tenderness  Neurological: He is alert and oriented to person, place, and time. No cranial nerve  deficit.  Skin: Skin is warm and dry.  There is mild erythema surround the wound with small amount of drainage.   Psychiatric: He has a normal mood and affect. His behavior is normal.  Nursing note and vitals reviewed.  Dr. Freida BusmanAllen in to see the patient. Will start antibiotics and patient will return for worsening symptoms. Stable for d/c at this time without focal neuro deficits and no abnormal findings on x-ray.    ED Treatments / Results: Wound cleaned, bacitracin ointment and dressing applied.   Labs (all labs ordered are listed, but only abnormal results are displayed) Labs Reviewed - No data to display Radiology Dg Tibia/fibula Left  Result Date: 06/02/2017 CLINICAL DATA:  Shin laceration with pain to the left leg EXAM: LEFT TIBIA AND FIBULA - 2 VIEW COMPARISON:  03/24/2009 FINDINGS: Mild patellofemoral degenerative changes with bony spurring present. No acute displaced fracture or malalignment is seen. There is no radiopaque foreign body in the soft tissues. Possible old avulsion injury adjacent to the lateral inferior talus. IMPRESSION: No acute osseous abnormality Electronically Signed   By: Jasmine PangKim  Fujinaga M.D.   On: 06/02/2017 22:23    Procedures Procedures (including critical care time)  Medications Ordered in ED Medications  Tdap (BOOSTRIX) injection 0.5 mL (0.5 mLs Intramuscular Given 06/02/17 2216)  bacitracin ointment (1 application Topical Given 06/02/17 2216)  clindamycin (CLEOCIN) capsule 300 mg (300 mg Oral Given 06/02/17 2310)  traMADol (ULTRAM) tablet 50 mg (50 mg Oral Given 06/02/17 2310)     Initial Impression / Assessment and Plan / ED Course  I have reviewed the triage vital signs and the nursing notes.   Final Clinical Impressions(s) / ED Diagnoses   Final diagnoses:  Abrasion of left leg, initial encounter  Contusion of left lower extremity, initial encounter  Wound infection    New Prescriptions Discharge Medication List as of 06/02/2017 10:47 PM      START taking these medications   Details  clindamycin (CLEOCIN) 300 MG capsule Take 1 capsule (300 mg total) by mouth 3 (three) times daily., Starting Sun 06/02/2017, Print    diclofenac (VOLTAREN) 50 MG EC tablet Take 1 tablet (50 mg total) by mouth 2 (two) times daily., Starting Sun 06/02/2017, Print    traMADol (ULTRAM) 50 MG tablet Take 1 tablet (50 mg total) by mouth every 6 (six) hours as needed., Starting Sun 06/02/2017, Print         CarpinteriaNeese, Merriam WoodsHope M, NP 06/03/17 [phone]

## 2017-06-02 NOTE — ED Triage Notes (Signed)
Pt c/o of left shin injury/laceration that he reportedly sustained from icemaker fell on his left leg.

## 2017-06-29 ENCOUNTER — Emergency Department (HOSPITAL_COMMUNITY)
Admission: EM | Admit: 2017-06-29 | Discharge: 2017-06-29 | Disposition: A | Discharge status: Home / Self Care | Payer: 59 | Attending: Emergency Medicine | Admitting: Emergency Medicine

## 2017-06-29 ENCOUNTER — Emergency Department (HOSPITAL_COMMUNITY): Payer: 59

## 2017-06-29 ENCOUNTER — Encounter (HOSPITAL_COMMUNITY): Payer: Self-pay | Admitting: Emergency Medicine

## 2017-06-29 ENCOUNTER — Emergency Department (HOSPITAL_BASED_OUTPATIENT_CLINIC_OR_DEPARTMENT_OTHER)
Admission: EM | Admit: 2017-06-29 | Discharge: 2017-06-29 | Disposition: A | Discharge status: Home / Self Care | Payer: 59 | Source: Home / Self Care

## 2017-06-29 DIAGNOSIS — M79609 Pain in unspecified limb: Secondary | ICD-10-CM

## 2017-06-29 DIAGNOSIS — X58XXXA Exposure to other specified factors, initial encounter: Secondary | ICD-10-CM | POA: Insufficient documentation

## 2017-06-29 DIAGNOSIS — S80812D Abrasion, left lower leg, subsequent encounter: Secondary | ICD-10-CM | POA: Insufficient documentation

## 2017-06-29 DIAGNOSIS — M25472 Effusion, left ankle: Secondary | ICD-10-CM

## 2017-06-29 DIAGNOSIS — F17210 Nicotine dependence, cigarettes, uncomplicated: Secondary | ICD-10-CM | POA: Insufficient documentation

## 2017-06-29 LAB — COMPREHENSIVE METABOLIC PANEL
ALBUMIN: 4.3 g/dL (ref 3.5–5.0)
ALT: 14 U/L — ABNORMAL LOW (ref 17–63)
ANION GAP: 8 (ref 5–15)
AST: 40 U/L (ref 15–41)
Alkaline Phosphatase: 55 U/L (ref 38–126)
BUN: 12 mg/dL (ref 6–20)
CHLORIDE: 108 mmol/L (ref 101–111)
CO2: 24 mmol/L (ref 22–32)
Calcium: 9.1 mg/dL (ref 8.9–10.3)
Creatinine, Ser: 0.79 mg/dL (ref 0.61–1.24)
GFR calc Af Amer: >60 mL/min (ref >60)
GFR calc non Af Amer: >60 mL/min (ref >60)
GLUCOSE: 96 mg/dL (ref 65–99)
POTASSIUM: 3.7 mmol/L (ref 3.5–5.1)
Sodium: 140 mmol/L (ref 135–145)
TOTAL PROTEIN: 7.6 g/dL (ref 6.5–8.1)
Total Bilirubin: 0.4 mg/dL (ref 0.3–1.2)

## 2017-06-29 LAB — CBC
HEMATOCRIT: 40.5 % (ref 39.0–52.0)
Hemoglobin: 14 g/dL (ref 13.0–17.0)
MCH: 31.7 pg (ref 26.0–34.0)
MCHC: 34.6 g/dL (ref 30.0–36.0)
MCV: 91.8 fL (ref 78.0–100.0)
PLATELETS: 181 10*3/uL (ref 150–400)
RBC: 4.41 MIL/uL (ref 4.22–5.81)
RDW: 12.7 % (ref 11.5–15.5)
WBC: 8.8 10*3/uL (ref 4.0–10.5)

## 2017-06-29 LAB — SALICYLATE LEVEL: Salicylate Lvl: <7 mg/dL (ref 2.8–30.0)

## 2017-06-29 LAB — ACETAMINOPHEN LEVEL

## 2017-06-29 LAB — ETHANOL: ALCOHOL ETHYL (B): 25 mg/dL — AB (ref <5)

## 2017-06-29 MED ORDER — CLINDAMYCIN HCL 300 MG PO CAPS: 300.0000 mg | ORAL_CAPSULE | Freq: Three times a day (TID) | ORAL | 0 refills | Status: AC

## 2017-06-29 NOTE — Progress Notes (Signed)
**  Preliminary report by tech**  Left lower extremity venous duplex complete. There is no evidence of deep or superficial vein thrombosis involving the left lower extremity. All visualized vessels appear patent and compressible. There is no evidence of a Baker's cyst on the left. Results were given to Dr. Particia NearingHaviland.  06/29/17 4:57 PM Olen CordialGreg Analeigh Aries RVT

## 2017-06-29 NOTE — ED Notes (Signed)
Report called to Schering-PloughCrystal, Charity fundraiserN at [facility].  Pt has been given a dinner tray but states he wasn't hungry so didn't eat any of it.  He is pleasant and d/c instructions have been provided.  [facility] has given a phone number for a [place] taxi service to call to transport pt back to [facility].  It has been called but it the voice mailbox is full and can't accept messages.  [facility] has been called back and informed of this and is going to reach out to them now.

## 2017-06-29 NOTE — ED Notes (Signed)
[facility]: [facility] Expected date:  Expected time:  Means of arrival:  Comments: 

## 2017-06-29 NOTE — ED Notes (Signed)
Vascular US at bedside.

## 2017-06-29 NOTE — ED Triage Notes (Signed)
Pt had L lower leg injury a month ago. Pt reports it has been healing up well since then. [facility], and was told to come here to rule out a DVT. No pain with dorsiflexion. Does have swelling to lower leg.
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
## 2017-06-29 NOTE — Discharge Instructions (Signed)
Medications: Clindamycin  Treatment: Take clindamycin to cover for infection. Keep the leg elevated whenever you're not walking on it. Use ice 3-4 times daily alternating 20 minutes on, 20 minutes off, to help with swelling. Wear brace to help support your ankle.  Follow-up: Please follow-up with orthopedics, Dr. Victorino DikeHewitt, if your symptoms are not improving. Please return to the emergency department if you develop any new or worsening symptoms.

## 2017-11-11 ENCOUNTER — Ambulatory Visit (HOSPITAL_COMMUNITY): Admission: EM | Admit: 2017-11-11 | Discharge: 2017-11-11 | Discharge status: Absent without leave | Payer: Self-pay

## 2019-02-16 IMAGING — CR DG TIBIA/FIBULA 2V*L*
4 series · 4 of 4 positions shown · non-contrast
Comparison: 03/24/2009

CLINICAL DATA: Shin laceration with pain to the left leg

EXAM:
LEFT TIBIA AND FIBULA - 2 VIEW

[x tib-fib ap left (1 of 2)]
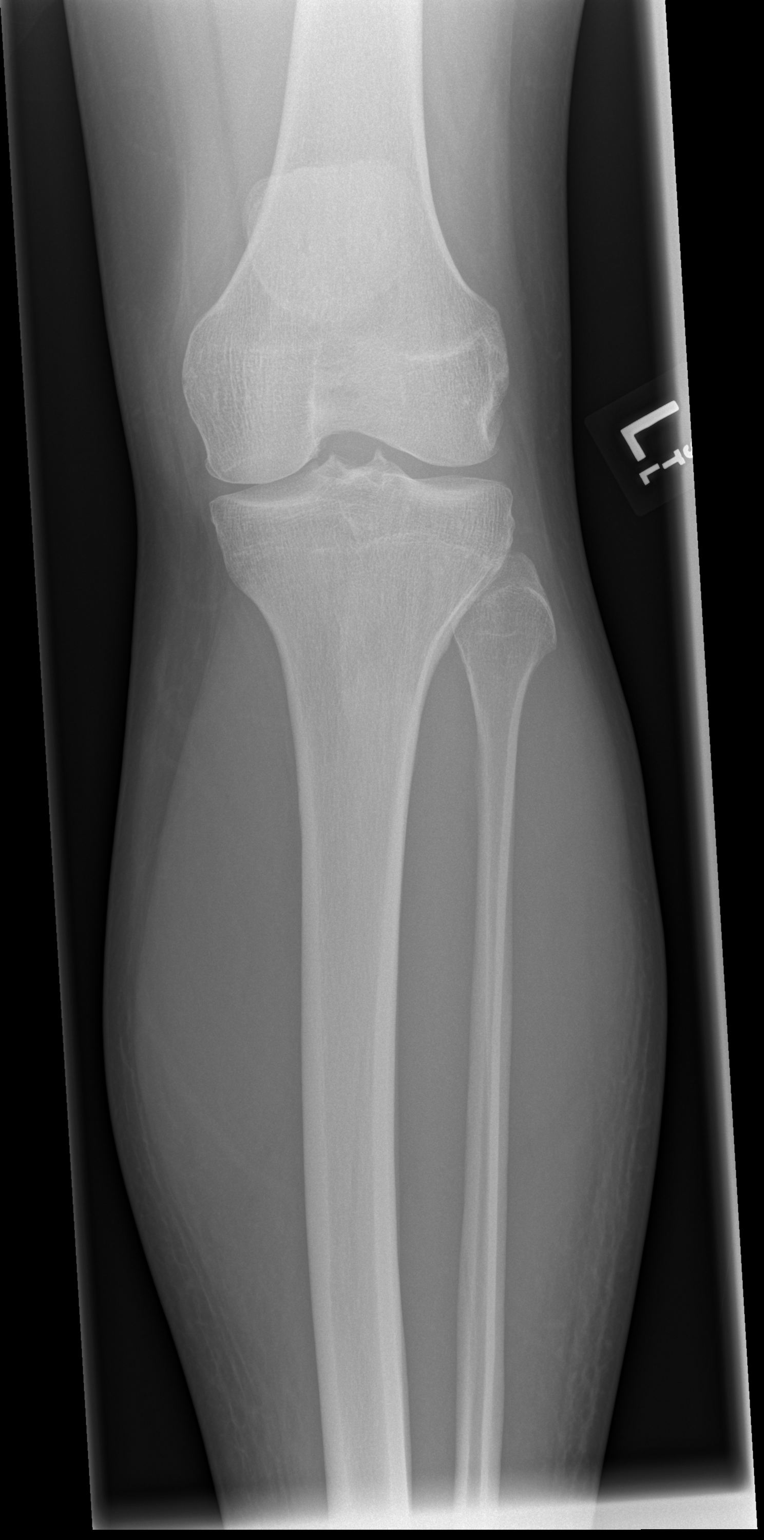

[x tib-fib ap left (2 of 2)]
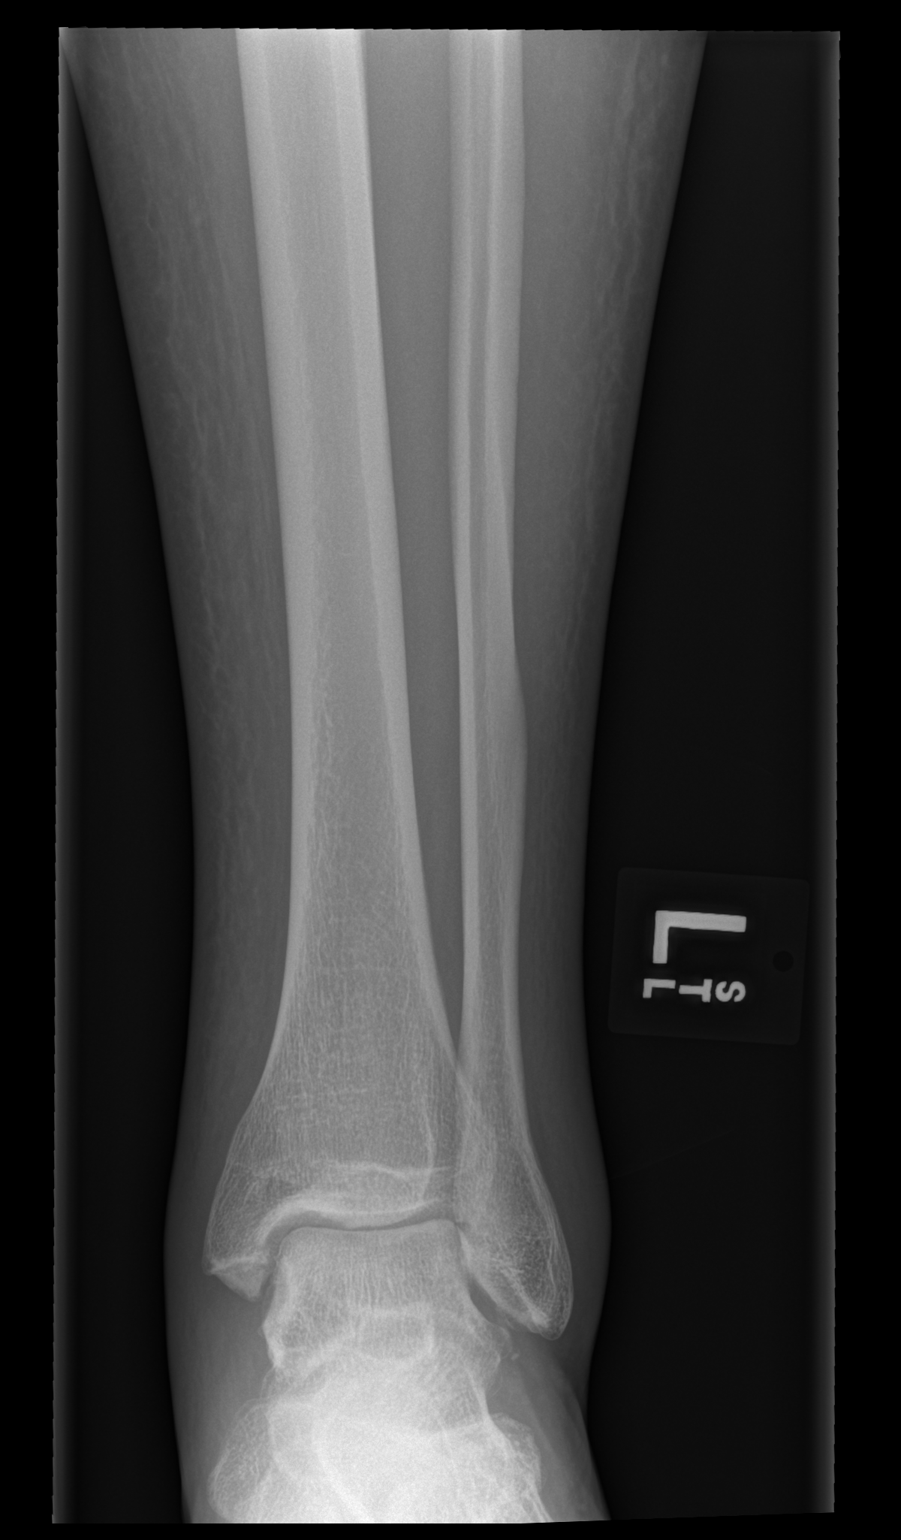

[x tib-fib lat left (1 of 2)]
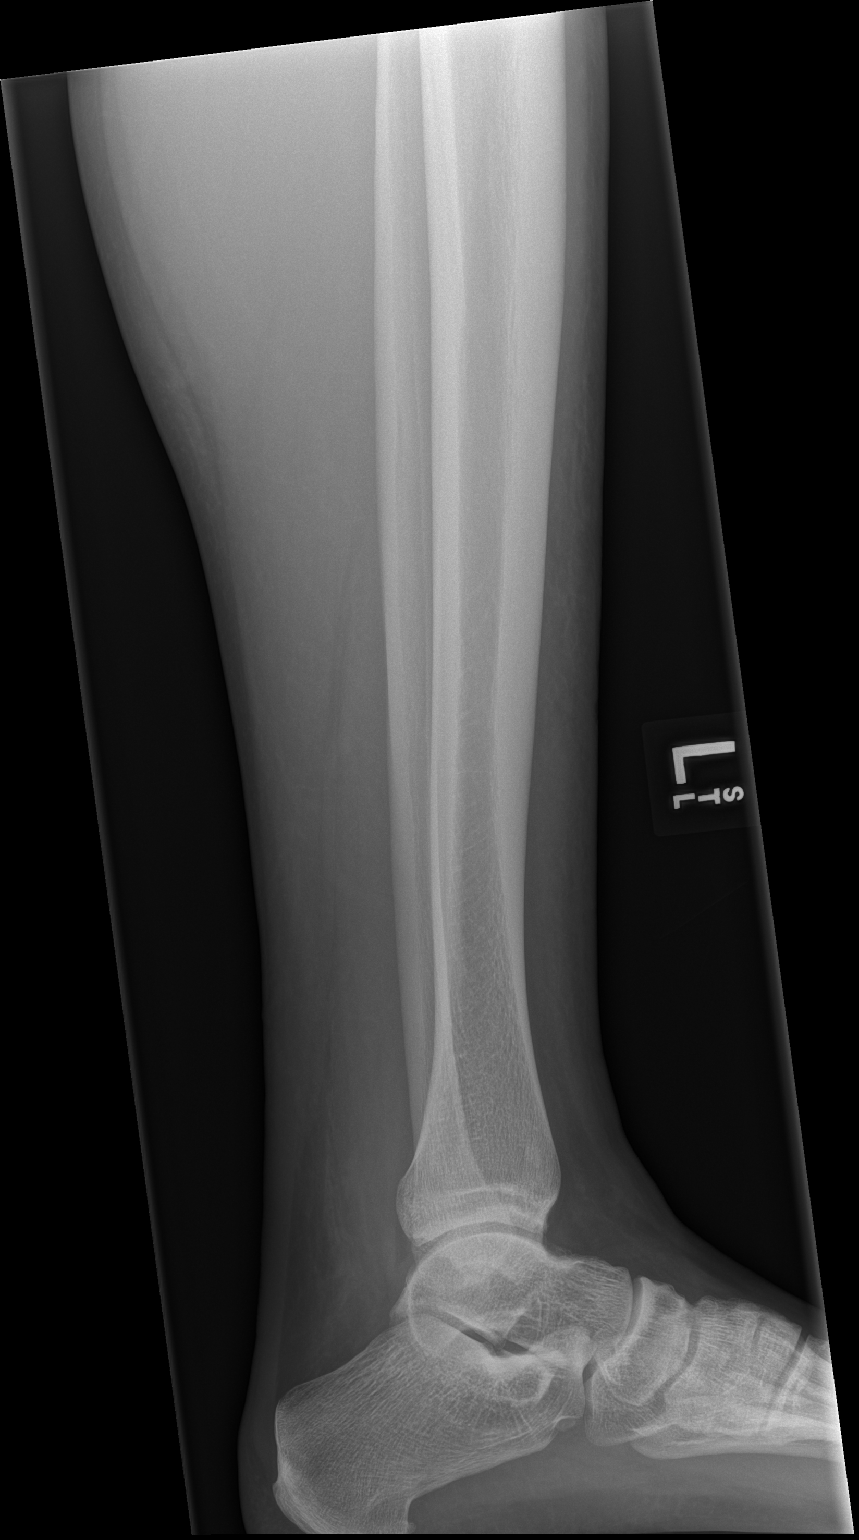

[x tib-fib lat left (2 of 2)]
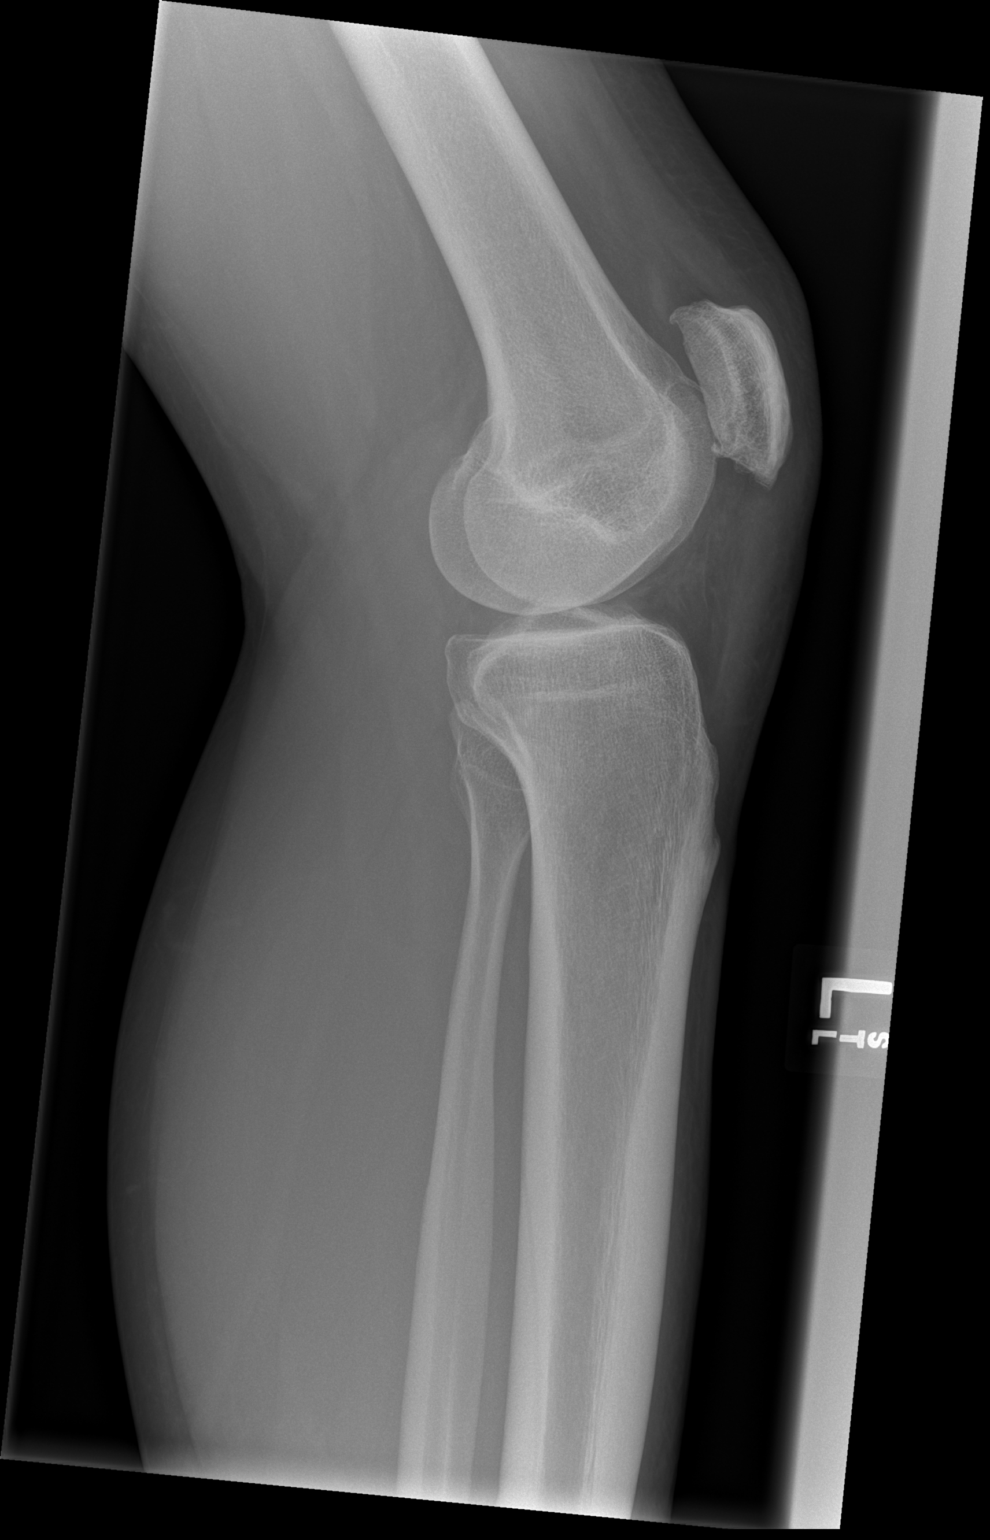

[4 of 4 positions shown; findings below may reference images not displayed]

FINDINGS: Mild patellofemoral degenerative changes with bony spurring present.
No acute displaced fracture or malalignment is seen. There is no
radiopaque foreign body in the soft tissues. Possible old avulsion
injury adjacent to the lateral inferior talus.
IMPRESSION: No acute osseous abnormality

## 2024-01-31 ENCOUNTER — Other Ambulatory Visit: Payer: Self-pay

## 2024-01-31 ENCOUNTER — Emergency Department (HOSPITAL_COMMUNITY)
Admission: EM | Admit: 2024-01-31 | Discharge: 2024-01-31 | Disposition: A | Discharge status: Home / Self Care | Payer: Self-pay

## 2024-01-31 ENCOUNTER — Encounter (HOSPITAL_COMMUNITY): Payer: Self-pay | Admitting: Emergency Medicine

## 2024-01-31 DIAGNOSIS — R21 Rash and other nonspecific skin eruption: Secondary | ICD-10-CM

## 2024-01-31 LAB — COMPREHENSIVE METABOLIC PANEL WITH GFR
ALT: 15 U/L (ref 0–44)
AST: 41 U/L (ref 15–41)
Albumin: 3.9 g/dL (ref 3.5–5.0)
Alkaline Phosphatase: 47 U/L (ref 38–126)
Anion gap: 6 (ref 5–15)
BUN: 13 mg/dL (ref 6–20)
CO2: 28 mmol/L (ref 22–32)
Calcium: 8.7 mg/dL — ABNORMAL LOW (ref 8.9–10.3)
Chloride: 101 mmol/L (ref 98–111)
Creatinine, Ser: 0.76 mg/dL (ref 0.61–1.24)
GFR, Estimated: >60 mL/min (ref >60)
Glucose, Bld: 97 mg/dL (ref 70–99)
Potassium: 3.6 mmol/L (ref 3.5–5.1)
Sodium: 135 mmol/L (ref 135–145)
Total Bilirubin: 0.8 mg/dL (ref 0.0–1.2)
Total Protein: 6.9 g/dL (ref 6.5–8.1)

## 2024-01-31 LAB — CBC WITH DIFFERENTIAL/PLATELET
Abs Immature Granulocytes: 0.04 10*3/uL (ref 0.00–0.07)
Basophils Absolute: 0.1 10*3/uL (ref 0.0–0.1)
Basophils Relative: 1 %
Eosinophils Absolute: 0.3 10*3/uL (ref 0.0–0.5)
Eosinophils Relative: 3 %
HCT: 40.5 % (ref 39.0–52.0)
Hemoglobin: 13.6 g/dL (ref 13.0–17.0)
Immature Granulocytes: 0 %
Lymphocytes Relative: 24 %
Lymphs Abs: 2.5 10*3/uL (ref 0.7–4.0)
MCH: 31.8 pg (ref 26.0–34.0)
MCHC: 33.6 g/dL (ref 30.0–36.0)
MCV: 94.6 fL (ref 80.0–100.0)
Monocytes Absolute: 0.5 10*3/uL (ref 0.1–1.0)
Monocytes Relative: 5 %
Neutro Abs: 7.1 10*3/uL (ref 1.7–7.7)
Neutrophils Relative %: 67 %
Platelets: 196 10*3/uL (ref 150–400)
RBC: 4.28 MIL/uL (ref 4.22–5.81)
RDW: 12.4 % (ref 11.5–15.5)
WBC: 10.5 10*3/uL (ref 4.0–10.5)
nRBC: 0 % (ref 0.0–0.2)

## 2024-01-31 MED ORDER — DOXYCYCLINE HYCLATE 100 MG PO CAPS: 100.0000 mg | ORAL_CAPSULE | Freq: Two times a day (BID) | ORAL | 0 refills | Status: AC

## 2024-01-31 MED ORDER — DOXYCYCLINE HYCLATE 100 MG PO TABS
100.0000 mg | ORAL_TABLET | Freq: Once | ORAL | Status: AC
  Administered 2024-01-31: 100 mg via ORAL
  Filled 2024-01-31: qty 1

## 2024-01-31 NOTE — ED Triage Notes (Signed)
 Patient presents post tick removal from his right calf on 01/23/24. He now has tingling on his leg, weakness, general body aches, fever (unknown value), blurry vision and redness around the site.

## 2024-01-31 NOTE — Discharge Instructions (Signed)
 You were evaluated in the emergency room following an insect bite.  Your rash and symptoms are concerning for possible Lyme disease.  Will treat you empirically with antibiotic.  Prescription for doxycycline was sent to pharmacy.  Please be sure to complete the full course of antibiotics.  You were provided resources to establish your primary care doctor for further management.  Initial labs were drawn today and reviewed on MyChart account.

## 2024-01-31 NOTE — ED Provider Notes (Signed)
 [place] EMERGENCY DEPARTMENT AT [facility] Provider Note   CSN: [id number] Arrival date & time: 01/31/24  2003     History  No chief complaint on file.   Curtis Moreno is a 43 y.o. male who presents with rash on his right calf after removing a tick on 01/23/24.  Patient is unsure how long the tick was attached.  Reports he had tingling in the leg at the site of the bite.  Reports feeling weak with generalized bodyaches.  States he first started noticing redness around this area earlier today.  Reports he is feeling somewhat lightheaded earlier today.  HPI    Past Medical History:  Diagnosis Date   Allergy    Chronic sinusitis    Headache    Hx of head injury    Narcolepsy      Home Medications Prior to Admission medications   Medication Sig Start Date End Date Taking? Authorizing Provider  citalopram (CELEXA) 10 MG tablet Take 1 tablet (10 mg total) by mouth daily. Patient not taking: Reported on 06/29/2017 03/16/17   Laveda Abbe, NP  clindamycin (CLEOCIN) 300 MG capsule Take 1 capsule (300 mg total) by mouth 3 (three) times daily. 06/29/17   Law, Waylan Boga, PA-C  diclofenac (VOLTAREN) 50 MG EC tablet Take 1 tablet (50 mg total) by mouth 2 (two) times daily. Patient not taking: Reported on 06/29/2017 06/02/17   Janne Napoleon, NP  hydrOXYzine (ATARAX/VISTARIL) 25 MG tablet Take 1 tablet (25 mg total) by mouth every 6 (six) hours as needed for anxiety. Patient not taking: Reported on 06/29/2017 03/16/17   Laveda Abbe, NP  ibuprofen (ADVIL,MOTRIN) 200 MG tablet Take 800 mg by mouth every 6 (six) hours as needed for headache or moderate pain.    [provider]  traMADol (ULTRAM) 50 MG tablet Take 1 tablet (50 mg total) by mouth every 6 (six) hours as needed. Patient not taking: Reported on 06/29/2017 06/02/17   Janne Napoleon, NP  traZODone (DESYREL) 50 MG tablet Take 1 tablet (50 mg total) by mouth at bedtime as needed for sleep. Patient not  taking: Reported on 06/29/2017 03/16/17   Laveda Abbe, NP      Allergies    Patient has no known allergies.    Review of Systems   Review of Systems  Skin:  Positive for rash.    Physical Exam Updated Vital Signs BP 115/80   Pulse 66   Temp 98.3 F (36.8 C) (Oral)   Resp 17   SpO2 97%  Physical Exam Vitals and nursing note reviewed.  Constitutional:      General: He is not in acute distress.    Appearance: He is well-developed.  HENT:     Head: Normocephalic and atraumatic.  Eyes:     Conjunctiva/sclera: Conjunctivae normal.  Cardiovascular:     Rate and Rhythm: Normal rate and regular rhythm.     Heart sounds: No murmur heard. Pulmonary:     Effort: Pulmonary effort is normal. No respiratory distress.     Breath sounds: Normal breath sounds.  Abdominal:     Palpations: Abdomen is soft.     Tenderness: There is no abdominal tenderness.  Musculoskeletal:        General: No swelling.     Cervical back: Neck supple.     Comments: Erythematous rash surrounding the right lateral right calf without significant tenderness or warmth, induration or fluctuance.  Negative Homans  Skin:  General: Skin is warm and dry.     Capillary Refill: Capillary refill takes less than 2 seconds.  Neurological:     Mental Status: He is alert.     Comments: Patient is alert and oriented. There is no abnormal phonation. Symmetric smile without facial droop. Moves all extremities spontaneously. 5/5 strength in upper and lower extremities. . No sensation deficit. There is no nystagmus. EOMI, PERRL. Coordination intact with finger to nose and normal ambulation.    Psychiatric:        Mood and Affect: Mood normal.     ED Results / Procedures / Treatments   Labs (all labs ordered are listed, but only abnormal results are displayed) Labs Reviewed  LYME DISEASE SEROLOGY W/REFLEX  CBC WITH DIFFERENTIAL/PLATELET  COMPREHENSIVE METABOLIC PANEL WITH GFR    EKG None  Radiology No  results found.  Procedures Procedures    Medications Ordered in ED Medications  doxycycline (VIBRA-TABS) tablet 100 mg (has no administration in time range)    ED Course/ Medical Decision Making/ A&P                                 Medical Decision Making  This patient presents to the ED with chief complaint(s) of rash .  The complaint involves an extensive differential diagnosis and also carries with it a high risk of complications and morbidity.   pertinent past medical history as listed in HPI  The differential diagnosis includes  Lyme disease, Cellulitis, DVT, abscess, URI, Pna, CVA, TIA  Additional history obtained: Additional history obtained from spouse Records reviewed Care Everywhere/External Records  Initial Assessment:   Hemodynamically stable, nontoxic.  Patient presenting with complaints of rash following removing a tick 8 days ago on 4/3.  Rash is erythematous with a very central target-like appearance.  There is no overlying warmth, tenderness, induration or fluctuance.  Do not suspect cellulitis or abscess.  Negative Homans.  He is able to ambulate without difficulty.  Do not suspect DVT.  He is reporting generalized myalgias with subjective fevers and chills.  Does report that there were some sick contacts at home.  It is unclear whether these symptoms are related to a URI or possibly Lyme disease.  Will treat empirically with Doxy x 10 days and obtain serology basic labs.  Will obtain EKG given symptoms of lightheadedness earlier today.  No other neurodeficits to suggest CVA or TIA.  He has no cardiac history, no associated shortness of breath.   Independent ECG interpretation:  Sinus rhythm without ischemic changes  Independent labs interpretation:  The following labs were independently interpreted:  CBC unremarkable, CMP without significant abnormality, Lyme serology pending  Independent visualization and interpretation of imaging: none  Treatment and  Reassessment: Patient given first dose of doxycycline  Consultations obtained:   none  Disposition:   Patient be discharged home.  Prescription for doxycycline sent into pharmacy.  He is aware Lyme serology is still pending and needs to follow-up with primary care doctor.  Provided resources to establish primary care doctor. The patient has been appropriately medically screened and/or stabilized in the ED. I have low suspicion for any other emergent medical condition which would require further screening, evaluation or treatment in the ED or require inpatient management. At time of discharge the patient is hemodynamically stable and in no acute distress. I have discussed work-up results and diagnosis with patient and answered all questions. Patient is agreeable  with discharge plan. We discussed strict return precautions for returning to the emergency department and they verbalized understanding.     Social Determinants of Health:   Patient's impaired access to primary care  increases the complexity of managing their presentation  This note was dictated with voice recognition software.  Despite best efforts at proofreading, errors may have occurred which can change the documentation meaning.          Final Clinical Impression(s) / ED Diagnoses Final diagnoses:  Rash    Rx / DC Orders ED Discharge Orders     None         Fabienne Bruns 01/31/24 2151    Durwin Glaze, MD 01/31/24 [phone]

## 2024-02-03 LAB — LYME DISEASE SEROLOGY W/REFLEX: Lyme Total Antibody EIA: NEGATIVE

## 2024-07-10 DIAGNOSIS — Z77098 Contact with and (suspected) exposure to other hazardous, chiefly nonmedicinal, chemicals: Secondary | ICD-10-CM | POA: Diagnosis present

## 2024-07-11 ENCOUNTER — Emergency Department (HOSPITAL_COMMUNITY)
Admission: EM | Admit: 2024-07-11 | Discharge: 2024-07-11 | Disposition: A | Discharge status: Home / Self Care | Attending: Emergency Medicine | Admitting: Emergency Medicine

## 2024-07-11 ENCOUNTER — Encounter (HOSPITAL_COMMUNITY): Payer: Self-pay | Admitting: Emergency Medicine

## 2024-07-11 ENCOUNTER — Other Ambulatory Visit: Payer: Self-pay

## 2024-07-11 ENCOUNTER — Emergency Department (HOSPITAL_COMMUNITY)

## 2024-07-11 DIAGNOSIS — Z77098 Contact with and (suspected) exposure to other hazardous, chiefly nonmedicinal, chemicals: Secondary | ICD-10-CM
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
